# Patient Record
Sex: Male | Born: 1955 | Race: White | Hispanic: No | Marital: Single | Smoking: Never smoker
Health system: Southern US, Community
[De-identification: ages and names within clinical notes are randomized; demographics above are authoritative.]

---

## 2015-06-25 DIAGNOSIS — D890 Polyclonal hypergammaglobulinemia: Secondary | ICD-10-CM | POA: Diagnosis not present

## 2015-06-25 DIAGNOSIS — B1920 Unspecified viral hepatitis C without hepatic coma: Secondary | ICD-10-CM | POA: Diagnosis not present

## 2017-11-30 DIAGNOSIS — F319 Bipolar disorder, unspecified: Secondary | ICD-10-CM

## 2017-11-30 DIAGNOSIS — M109 Gout, unspecified: Secondary | ICD-10-CM

## 2017-11-30 DIAGNOSIS — N183 Chronic kidney disease, stage 3 (moderate): Secondary | ICD-10-CM | POA: Diagnosis not present

## 2017-11-30 DIAGNOSIS — A419 Sepsis, unspecified organism: Secondary | ICD-10-CM

## 2017-11-30 DIAGNOSIS — M4800 Spinal stenosis, site unspecified: Secondary | ICD-10-CM | POA: Diagnosis not present

## 2017-11-30 DIAGNOSIS — N390 Urinary tract infection, site not specified: Secondary | ICD-10-CM

## 2017-11-30 DIAGNOSIS — G540 Brachial plexus disorders: Secondary | ICD-10-CM | POA: Diagnosis not present

## 2017-11-30 DIAGNOSIS — N400 Enlarged prostate without lower urinary tract symptoms: Secondary | ICD-10-CM | POA: Diagnosis not present

## 2017-12-01 DIAGNOSIS — M4800 Spinal stenosis, site unspecified: Secondary | ICD-10-CM | POA: Diagnosis not present

## 2017-12-01 DIAGNOSIS — N183 Chronic kidney disease, stage 3 (moderate): Secondary | ICD-10-CM | POA: Diagnosis not present

## 2017-12-01 DIAGNOSIS — G540 Brachial plexus disorders: Secondary | ICD-10-CM | POA: Diagnosis not present

## 2017-12-01 DIAGNOSIS — N400 Enlarged prostate without lower urinary tract symptoms: Secondary | ICD-10-CM | POA: Diagnosis not present

## 2017-12-02 DIAGNOSIS — G540 Brachial plexus disorders: Secondary | ICD-10-CM | POA: Diagnosis not present

## 2017-12-02 DIAGNOSIS — N400 Enlarged prostate without lower urinary tract symptoms: Secondary | ICD-10-CM | POA: Diagnosis not present

## 2017-12-02 DIAGNOSIS — N183 Chronic kidney disease, stage 3 (moderate): Secondary | ICD-10-CM | POA: Diagnosis not present

## 2017-12-02 DIAGNOSIS — M4800 Spinal stenosis, site unspecified: Secondary | ICD-10-CM | POA: Diagnosis not present

## 2017-12-03 DIAGNOSIS — G540 Brachial plexus disorders: Secondary | ICD-10-CM | POA: Diagnosis not present

## 2017-12-03 DIAGNOSIS — N183 Chronic kidney disease, stage 3 (moderate): Secondary | ICD-10-CM | POA: Diagnosis not present

## 2017-12-03 DIAGNOSIS — N400 Enlarged prostate without lower urinary tract symptoms: Secondary | ICD-10-CM | POA: Diagnosis not present

## 2017-12-03 DIAGNOSIS — M4800 Spinal stenosis, site unspecified: Secondary | ICD-10-CM | POA: Diagnosis not present

## 2018-06-03 DIAGNOSIS — Z8673 Personal history of transient ischemic attack (TIA), and cerebral infarction without residual deficits: Secondary | ICD-10-CM

## 2018-06-03 DIAGNOSIS — A419 Sepsis, unspecified organism: Secondary | ICD-10-CM

## 2018-06-03 DIAGNOSIS — A498 Other bacterial infections of unspecified site: Secondary | ICD-10-CM

## 2018-06-03 DIAGNOSIS — M4800 Spinal stenosis, site unspecified: Secondary | ICD-10-CM

## 2018-06-03 DIAGNOSIS — I10 Essential (primary) hypertension: Secondary | ICD-10-CM

## 2018-06-03 DIAGNOSIS — G40909 Epilepsy, unspecified, not intractable, without status epilepticus: Secondary | ICD-10-CM

## 2018-06-03 DIAGNOSIS — G9341 Metabolic encephalopathy: Secondary | ICD-10-CM

## 2018-06-03 DIAGNOSIS — N179 Acute kidney failure, unspecified: Secondary | ICD-10-CM | POA: Diagnosis not present

## 2018-06-03 DIAGNOSIS — G20 Parkinson's disease: Secondary | ICD-10-CM | POA: Diagnosis not present

## 2018-06-03 DIAGNOSIS — T148XXA Other injury of unspecified body region, initial encounter: Secondary | ICD-10-CM

## 2018-06-04 DIAGNOSIS — G40909 Epilepsy, unspecified, not intractable, without status epilepticus: Secondary | ICD-10-CM | POA: Diagnosis not present

## 2018-06-04 DIAGNOSIS — I10 Essential (primary) hypertension: Secondary | ICD-10-CM | POA: Diagnosis not present

## 2018-06-04 DIAGNOSIS — G20 Parkinson's disease: Secondary | ICD-10-CM | POA: Diagnosis not present

## 2018-06-04 DIAGNOSIS — N179 Acute kidney failure, unspecified: Secondary | ICD-10-CM | POA: Diagnosis not present

## 2018-06-05 DIAGNOSIS — G20 Parkinson's disease: Secondary | ICD-10-CM | POA: Diagnosis not present

## 2018-06-05 DIAGNOSIS — G40909 Epilepsy, unspecified, not intractable, without status epilepticus: Secondary | ICD-10-CM | POA: Diagnosis not present

## 2018-06-05 DIAGNOSIS — I10 Essential (primary) hypertension: Secondary | ICD-10-CM | POA: Diagnosis not present

## 2018-06-05 DIAGNOSIS — N179 Acute kidney failure, unspecified: Secondary | ICD-10-CM | POA: Diagnosis not present

## 2018-06-06 DIAGNOSIS — G20 Parkinson's disease: Secondary | ICD-10-CM | POA: Diagnosis not present

## 2018-06-06 DIAGNOSIS — N179 Acute kidney failure, unspecified: Secondary | ICD-10-CM | POA: Diagnosis not present

## 2018-06-06 DIAGNOSIS — I10 Essential (primary) hypertension: Secondary | ICD-10-CM | POA: Diagnosis not present

## 2018-06-06 DIAGNOSIS — G40909 Epilepsy, unspecified, not intractable, without status epilepticus: Secondary | ICD-10-CM | POA: Diagnosis not present

## 2018-06-07 DIAGNOSIS — G20 Parkinson's disease: Secondary | ICD-10-CM | POA: Diagnosis not present

## 2018-06-07 DIAGNOSIS — G40909 Epilepsy, unspecified, not intractable, without status epilepticus: Secondary | ICD-10-CM | POA: Diagnosis not present

## 2018-06-07 DIAGNOSIS — N179 Acute kidney failure, unspecified: Secondary | ICD-10-CM | POA: Diagnosis not present

## 2018-06-07 DIAGNOSIS — I10 Essential (primary) hypertension: Secondary | ICD-10-CM | POA: Diagnosis not present

## 2018-06-08 DIAGNOSIS — G40909 Epilepsy, unspecified, not intractable, without status epilepticus: Secondary | ICD-10-CM | POA: Diagnosis not present

## 2018-06-08 DIAGNOSIS — I10 Essential (primary) hypertension: Secondary | ICD-10-CM | POA: Diagnosis not present

## 2018-06-08 DIAGNOSIS — G20 Parkinson's disease: Secondary | ICD-10-CM | POA: Diagnosis not present

## 2018-06-08 DIAGNOSIS — N179 Acute kidney failure, unspecified: Secondary | ICD-10-CM | POA: Diagnosis not present

## 2018-10-21 ENCOUNTER — Inpatient Hospital Stay (HOSPITAL_COMMUNITY)
Admission: AD | Admit: 2018-10-21 | Discharge: 2018-10-26 | DRG: 178 | Disposition: A | Discharge status: Skilled Nursing Facility | Payer: Medicare Other | Source: Other Acute Inpatient Hospital | Attending: Internal Medicine | Admitting: Internal Medicine

## 2018-10-21 ENCOUNTER — Other Ambulatory Visit: Payer: Self-pay

## 2018-10-21 ENCOUNTER — Encounter (HOSPITAL_COMMUNITY): Payer: Self-pay

## 2018-10-21 DIAGNOSIS — N183 Chronic kidney disease, stage 3 (moderate): Secondary | ICD-10-CM | POA: Diagnosis present

## 2018-10-21 DIAGNOSIS — N289 Disorder of kidney and ureter, unspecified: Secondary | ICD-10-CM

## 2018-10-21 DIAGNOSIS — L8990 Pressure ulcer of unspecified site, unspecified stage: Secondary | ICD-10-CM | POA: Insufficient documentation

## 2018-10-21 DIAGNOSIS — F209 Schizophrenia, unspecified: Secondary | ICD-10-CM | POA: Diagnosis present

## 2018-10-21 DIAGNOSIS — N179 Acute kidney failure, unspecified: Secondary | ICD-10-CM | POA: Diagnosis present

## 2018-10-21 DIAGNOSIS — G40909 Epilepsy, unspecified, not intractable, without status epilepticus: Secondary | ICD-10-CM | POA: Diagnosis present

## 2018-10-21 DIAGNOSIS — G20 Parkinson's disease: Secondary | ICD-10-CM | POA: Diagnosis present

## 2018-10-21 DIAGNOSIS — M4802 Spinal stenosis, cervical region: Secondary | ICD-10-CM | POA: Diagnosis present

## 2018-10-21 DIAGNOSIS — Z66 Do not resuscitate: Secondary | ICD-10-CM | POA: Diagnosis present

## 2018-10-21 DIAGNOSIS — Z87440 Personal history of urinary (tract) infections: Secondary | ICD-10-CM

## 2018-10-21 DIAGNOSIS — Z23 Encounter for immunization: Secondary | ICD-10-CM | POA: Diagnosis not present

## 2018-10-21 DIAGNOSIS — G540 Brachial plexus disorders: Secondary | ICD-10-CM | POA: Diagnosis present

## 2018-10-21 DIAGNOSIS — Z7401 Bed confinement status: Secondary | ICD-10-CM | POA: Diagnosis not present

## 2018-10-21 DIAGNOSIS — N400 Enlarged prostate without lower urinary tract symptoms: Secondary | ICD-10-CM | POA: Diagnosis present

## 2018-10-21 DIAGNOSIS — J988 Other specified respiratory disorders: Secondary | ICD-10-CM | POA: Diagnosis not present

## 2018-10-21 DIAGNOSIS — I129 Hypertensive chronic kidney disease with stage 1 through stage 4 chronic kidney disease, or unspecified chronic kidney disease: Secondary | ICD-10-CM | POA: Diagnosis present

## 2018-10-21 DIAGNOSIS — F319 Bipolar disorder, unspecified: Secondary | ICD-10-CM | POA: Diagnosis present

## 2018-10-21 DIAGNOSIS — D631 Anemia in chronic kidney disease: Secondary | ICD-10-CM | POA: Diagnosis present

## 2018-10-21 DIAGNOSIS — E876 Hypokalemia: Secondary | ICD-10-CM | POA: Diagnosis not present

## 2018-10-21 DIAGNOSIS — N3000 Acute cystitis without hematuria: Secondary | ICD-10-CM | POA: Diagnosis not present

## 2018-10-21 DIAGNOSIS — E870 Hyperosmolality and hypernatremia: Secondary | ICD-10-CM | POA: Diagnosis not present

## 2018-10-21 DIAGNOSIS — U071 COVID-19: Secondary | ICD-10-CM | POA: Diagnosis present

## 2018-10-21 DIAGNOSIS — R1110 Vomiting, unspecified: Secondary | ICD-10-CM | POA: Diagnosis not present

## 2018-10-21 DIAGNOSIS — N390 Urinary tract infection, site not specified: Secondary | ICD-10-CM | POA: Diagnosis present

## 2018-10-21 LAB — CBC
HCT: 39.7 % (ref 39.0–52.0)
Hemoglobin: 12.4 g/dL — ABNORMAL LOW (ref 13.0–17.0)
MCH: 29.7 pg (ref 26.0–34.0)
MCHC: 31.2 g/dL (ref 30.0–36.0)
MCV: 95.2 fL (ref 80.0–100.0)
Platelets: 101 10*3/uL — ABNORMAL LOW (ref 150–400)
RBC: 4.17 MIL/uL — ABNORMAL LOW (ref 4.22–5.81)
RDW: 15 % (ref 11.5–15.5)
WBC: 6.4 10*3/uL (ref 4.0–10.5)
nRBC: 0 % (ref 0.0–0.2)

## 2018-10-21 LAB — SARS CORONAVIRUS 2 BY RT PCR (HOSPITAL ORDER, PERFORMED IN ~~LOC~~ HOSPITAL LAB): SARS Coronavirus 2: POSITIVE — AB

## 2018-10-21 LAB — CREATININE, SERUM
Creatinine, Ser: 2.31 mg/dL — ABNORMAL HIGH (ref 0.61–1.24)
GFR calc Af Amer: 34 mL/min — ABNORMAL LOW (ref >60)
GFR calc non Af Amer: 30 mL/min — ABNORMAL LOW (ref >60)

## 2018-10-21 LAB — MRSA PCR SCREENING: MRSA by PCR: POSITIVE — AB

## 2018-10-21 MED ORDER — SODIUM CHLORIDE 0.9 % IV SOLN
INTRAVENOUS | Status: DC
  Administered 2018-10-21: 16:00:00 via INTRAVENOUS

## 2018-10-21 MED ORDER — DIVALPROEX SODIUM 500 MG PO DR TAB
1000.0000 mg | DELAYED_RELEASE_TABLET | Freq: Every day | ORAL | Status: DC
  Administered 2018-10-21: 1000 mg via ORAL
  Filled 2018-10-21: qty 2

## 2018-10-21 MED ORDER — VITAMIN C 500 MG PO TABS
500.0000 mg | ORAL_TABLET | Freq: Every day | ORAL | Status: DC
  Administered 2018-10-21 – 2018-10-26 (×6): 500 mg via ORAL
  Filled 2018-10-21 (×6): qty 1

## 2018-10-21 MED ORDER — ONDANSETRON HCL 4 MG/2ML IJ SOLN
4.0000 mg | Freq: Four times a day (QID) | INTRAMUSCULAR | Status: DC | PRN
  Administered 2018-10-21 (×2): 4 mg via INTRAVENOUS
  Filled 2018-10-21 (×2): qty 2

## 2018-10-21 MED ORDER — PERPHENAZINE 4 MG PO TABS
4.0000 mg | ORAL_TABLET | Freq: Every day | ORAL | Status: DC
  Administered 2018-10-21: 4 mg via ORAL
  Filled 2018-10-21: qty 1

## 2018-10-21 MED ORDER — SODIUM CHLORIDE 0.9 % IV SOLN
1.0000 g | INTRAVENOUS | Status: DC
  Administered 2018-10-21 – 2018-10-23 (×3): 1 g via INTRAVENOUS
  Filled 2018-10-21 (×3): qty 10

## 2018-10-21 MED ORDER — LEVETIRACETAM 500 MG PO TABS
500.0000 mg | ORAL_TABLET | Freq: Two times a day (BID) | ORAL | Status: DC
  Filled 2018-10-21 (×2): qty 1

## 2018-10-21 MED ORDER — ENSURE ENLIVE PO LIQD
237.0000 mL | Freq: Two times a day (BID) | ORAL | Status: DC
  Administered 2018-10-21 – 2018-10-26 (×6): 237 mL via ORAL

## 2018-10-21 MED ORDER — PERPHENAZINE 2 MG PO TABS
2.0000 mg | ORAL_TABLET | Freq: Every morning | ORAL | Status: DC
  Filled 2018-10-21: qty 1

## 2018-10-21 MED ORDER — BENZTROPINE MESYLATE 1 MG PO TABS
1.0000 mg | ORAL_TABLET | Freq: Two times a day (BID) | ORAL | Status: DC
  Administered 2018-10-21: 1 mg via ORAL
  Filled 2018-10-21 (×2): qty 1

## 2018-10-21 MED ORDER — ACETAMINOPHEN 650 MG RE SUPP: 650.0000 mg | Freq: Four times a day (QID) | RECTAL | Status: DC | PRN

## 2018-10-21 MED ORDER — ZINC SULFATE 220 (50 ZN) MG PO CAPS
220.0000 mg | ORAL_CAPSULE | Freq: Every day | ORAL | Status: DC
  Administered 2018-10-21 – 2018-10-26 (×6): 220 mg via ORAL
  Filled 2018-10-21 (×6): qty 1

## 2018-10-21 MED ORDER — LEVETIRACETAM 500 MG PO TABS
500.0000 mg | ORAL_TABLET | Freq: Two times a day (BID) | ORAL | Status: DC
  Filled 2018-10-21: qty 1

## 2018-10-21 MED ORDER — ACETAMINOPHEN 325 MG PO TABS
650.0000 mg | ORAL_TABLET | Freq: Four times a day (QID) | ORAL | Status: DC | PRN
  Administered 2018-10-21: 650 mg via ORAL
  Filled 2018-10-21: qty 2

## 2018-10-21 MED ORDER — DIVALPROEX SODIUM 500 MG PO DR TAB
500.0000 mg | DELAYED_RELEASE_TABLET | Freq: Every morning | ORAL | Status: DC
  Filled 2018-10-21: qty 1

## 2018-10-21 MED ORDER — HEPARIN SODIUM (PORCINE) 5000 UNIT/ML IJ SOLN
5000.0000 [IU] | Freq: Three times a day (TID) | INTRAMUSCULAR | Status: DC
  Administered 2018-10-21 – 2018-10-26 (×16): 5000 [IU] via SUBCUTANEOUS
  Filled 2018-10-21 (×16): qty 1

## 2018-10-21 MED ORDER — PNEUMOCOCCAL VAC POLYVALENT 25 MCG/0.5ML IJ INJ
0.5000 mL | INJECTION | INTRAMUSCULAR | Status: AC
  Administered 2018-10-22: 0.5 mL via INTRAMUSCULAR
  Filled 2018-10-21: qty 0.5

## 2018-10-21 NOTE — Progress Notes (Addendum)
Pt arrived from [facility] via ambulance. Pt on RA. VSS. Pt is alert and oriented to self and location. Pt placed on tele, pulse ox, condom cath for incontinence. Pt verbalizes that he is able to feed himself. Will verify with dinner.  MD paged via epic chat to notify of arrival.   Per report from [place] [facility] [place] pt is from [facility].

## 2018-10-21 NOTE — Progress Notes (Signed)
Paged Dr Thereasa Solo to request admit orders.

## 2018-10-21 NOTE — Progress Notes (Signed)
CRITICAL VALUE ALERT  Critical Value: +MRSA (nares), +covid-19  Date & Time Notied:  10/21/18 at 1953  Provider Notified: text page sent to Dr. Roger Shelter  Orders Received/Actions taken: awaiting orders

## 2018-10-21 NOTE — Progress Notes (Signed)
Pt provided with coke to drink. Pt vomited x3 aprox 1 hour after drinking coke. MD notified and pt given PRN zofran & made NPO at this time. See new orders.

## 2018-10-21 NOTE — Progress Notes (Signed)
Dr Waldron [facility] called and verified pt's admission. Will put in orders.

## 2018-10-21 NOTE — Progress Notes (Signed)
Attempted to call patient's mother to introduce myself and provide an update. No answer. Left message.

## 2018-10-21 NOTE — Care Management (Addendum)
Case manager acknowledged referral,will continue to monitor for appropriate disposition when patient medically improves. May God Bless hime to do so. Patient if from [facility],       Ricki Miller, RN BSN Case Manager [phone]

## 2018-10-21 NOTE — H&P (Addendum)
TRH H&P   Patient Demographics:    Jamie Drake, is a 63 y.o. male  MRN: [id number]   DOB - 12/20/1955  Admit Date - 10/21/2018  Outpatient Primary MD for the patient is Shelbie AmmonsHaque, Imran P, MD  Referring MD/NP/PA: From [facility]  Patient coming from: SNF No chief complaint on file.     HPI:   Jamie Drake  is a 63 y.o. male, with Parkinson disease, bed-bound, lives in facility, bipolar disorder, schizophrenia, CVA, cervical stenosis, CKD III, recurrent UTI and thoracic outlet syndrome presenting from SNF with fever.  Patient himself denies any complaints, he is bedbound for last 4 years.  He is however secondary to cervical stenosis, SNF report patient +3 days ago for COVID-19, and had fever 102 at facility so he was sent to ED for further evaluation, patient himself denies any complaints.  In ED it was afebrile, saturating 97% on room air labs were significant for creatinine of 2.3, evaded CRP, total CK and ferritin, had positive UA, he was given Invanz, and transferred to [facility] given his positive COVID-19 for further management.     Review of systems:    Overall patient is poor historian, his review of system is very unreliable. In addition to the HPI above,  Reports  Fever-chills, No Headache, No changes with Vision or hearing, No problems swallowing food or Liquids, No Chest pain, Cough or Shortness of Breath, No Abdominal pain, No Nausea or Vommitting, Bowel movements are regular, No Blood in stool or Urine, No dysuria, No new skin rashes or bruises, No new joints pains-aches,  Patient reported chronic generalized weakness, bedbound for 4 years No recent weight gain or loss, No polyuria, polydypsia or polyphagia, No significant Mental Stressors.    With Past History of the following :   Parkinson disease, bipolar disorder, schizophrenia, CVA, cervical stenosis,  wheelchair bound, CKD-3, recurrent UTI and thoracic outlet syndrome .     Social History:     Social History   Tobacco Use  . Smoking status: Not on file  Substance Use Topics  . Alcohol use: Not on file     Lives - at SNF  Mobility - bed bound      Family History :   Diabetes in mother, cancer in father   Home Medications:        Omega-3 Fatty Acids/Fish Oil [Fish Oil 1,000 mg Capsule] 1 cap PO 0800    Ascorbic Acid [Vitamin C] 500 mg PO DAILY    Multivitamin [Multivitamins] 1 cap PO DAILY    Perphenazine [Trilafon] 2 mg PO QAM    Vitamin E 400 unit PO DAILY    Atorvastatin Calcium [Lipitor] 40 mg PO QHS    Perphenazine 4 mg PO QHS    Tamsulosin HCl [Flomax] 0.4 mg PO 2000    Docusate Sodium [Dok] 100 mg PO QAM    Divalproex  Sodium [Depakote] 500 mg PO QAM    Divalproex Sodium [Depakote] 1,000 mg PO QHS    Benztropine Mesylate [Cogentin] 1 mg PO BID    Metoprolol Tartrate [Lopressor] 12.5 mg PO Q12H    Gabapentin [Neurontin] 100 mg PO BID    Propylene Glycol [Systane Balance] 1 drop OU DAILY    Loperamide HCl [Loperamide] 4 mg PO AS DIR PRN      PRN Reason: Diarrhea    Loperamide HCl [Loperamide] 2 mg PO AS DIR PRN      PRN Reason: Diarrhea    Nitrofurantoin Macrocrystal [Macrodantin] 100 mg PO Q12H    Acetaminophen [Tylenol] 650 mg PO Q6H PRN      PRN Reason: Pain    Ertapenem [Invanz] 1 gm IV Q24H #7 vial    Levetiracetam [Keppra] 500 mg PO BID 10 Days  tablet  Allergies:   No Known Allergies Allergy   Physical Exam:   Vitals  Blood pressure (!) 116/96, pulse 89, temperature 98.5 F (36.9 C), temperature source Oral, resp. rate (!) 30, SpO2 100 %.   1. General pale, elderly male, laying in bed, in no apparent distress  2. Normal affect, mildly confused, impaired judgment and insight currently, oriented x2.  3. No F.N deficits, ALL C.Nerves Intact, have generalized weakness, with significant spasticity, diminished range of motion due to  rigidity.and   4. Ears and Eyes appear Normal, Conjunctivae clear, PERRLA. Moist Oral Mucosa.  5. Supple Neck, No JVD, No cervical lymphadenopathy appriciated, No Carotid Bruits.  6. Symmetrical Chest wall movement, Good air movement bilaterally, CTAB.  7. RRR, No Gallops, Rubs or Murmurs, No Parasternal Heave.  8. Positive Bowel Sounds, Abdomen Soft, No tenderness, No organomegaly appriciated,No rebound -guarding or rigidity.  9.  No Cyanosis, Normal Skin Turgor, No Skin Rash or Bruise.  10. Good muscle tone,  joints appear normal , no effusions, Normal ROM.  11. No Palpable Lymph Nodes in Neck or Axillae     Data Review:   - Results  10/20/18 23:55 [Image 0]  10/21/18 01:00 [Image 1] Laboratory Results - last 24 hr  10/20/18 23:55: Lactic Acid 1.2 10/20/18 23:55: WBC 7.6, RBC 4.72, Hgb 14.1, Hct 43.2, MCV 91, MCH 29.9, MCHC 32.7 L, RDW 16.2 H, Plt Count 128 L, MPV 9.0, Neut % (Auto) 60.7, Lymph % (Auto) 22.3, Mono % (Auto) 16.5 H, Eos % (Auto) 0.1, Baso % (Auto) 0.4, Absolute Neuts (auto) 4.56, Absolute Lymphs (auto) 1.67 10/20/18 23:55: D-Dimer Quant (PE/DVT) 661 H 10/20/18 23:55: Ferritin 574.0 H 10/20/18 23:55: PT 11.1, INR 1.1 10/21/18 01:00: Sodium 146, Potassium 4.2, Chloride 113 H, Carbon Dioxide 19 L, Anion Gap 18 H, BUN 34 H, Creatinine 2.40 H, Estimated GFR (MDRD) 28 L, Glucose 104 H, Calculated Osmolality 289, Calcium 9.5, Prot Corrected Calcium 9.7, Total Bilirubin 0.6, AST 49, ALT 35, Alkaline Phosphatase 84, Total Creatine Kinase 174 H, Troponin I < 0.01, C-Reactive Prot, Quant 35.8 H, Total Protein 8.0, Albumin 3.8 10/21/18 01:00: Procalcitonin 0.15 H 10/21/18 01:00: Urine Color YELLOW, Urine Clarity SL CLDY, Urine pH 5.0, Ur Specific Gravity 1.005, Urine Protein 1+ H, Urine Glucose (UA) NEG, Urine Ketones NEG, Urine Occult Blood 1+ H, Urine Nitrite POS H, Urine Bilirubin NEG, Urine Urobilinogen <2.0, Ur Leukocyte Esterase 2+ H, Urine RBC 0-2, Urine WBC TNTC H,  Urine WBC Clumps PRESENT H, Ur Epithelial Cells 1+, Urine Bacteria 4+ H   - EKG   ** EKG #1 EKG Time: 00:01 -: Yes EKG interpreted by  me Rate: bpm: 93 Rhythm: NSR ST: Nonsp WAS THE FOLEY CATHETER PRESENT ON ADMISSION?: Yes  ----------------------------------------------------------------------------------------------------------------   Imaging Results:   Exam(s): [id number] RAD/DG CHEST PORTABLE CLINICAL DATA:  Fever.  Shortness of breath.  EXAM: PORTABLE CHEST 1 VIEW  COMPARISON:  Chest x-ray dated 06/03/2018  FINDINGS: Heart size is normal. There is no pneumothorax. No large pleural effusion. No acute osseous abnormality. There is slightly greater than expected density in the retrocardiac region. This may be in part due to an overlying cardiac lead.  IMPRESSION: Slightly greater than expected density in the retrocardiac region which may be in part due to an overlying cardiac lead. A developing infiltrate is difficult to exclude in this location. Otherwise, the lungs are unremarkable.   Electronically Signed   By: Constance Holster M.D.   On: 10/21/2018 00:57  Electronically Signed By: Jamse Mead MD  Electronically Signed Date/Time: 06/06/200100 Dictate Date/Time: 10/21/18 0055  Technologist: Pennie Banter L Transcribed By: Odie Sera Transcribed Date/Time: 10/21/18 0057   Assessment & Plan:    Active Problems:   AKI (acute kidney injury) ([place])   COVID-19   Renal insufficiency   UTI (urinary tract infection)   COVID 19 -Patient denies fever, fortunately he has no dyspnea, or hypoxia, has elevated inflammatory markers, will continue to clinically, clinically no indication for Actemra , Remdesivir or  steroids  UTI -Patient with positive urinalysis, he is received Invanz in [facility] ED, I will start on rocephin and follow on urine cultures  AKI on CKD III: -Baseline creatinine 1.5, it is 2.4 on admission, continue with IV fluids .  Parkinson's disease  -Continue with home perphenazine, benztropine .  History of seizures -continue with Depakote.  Hypertension -resume home meds.  BPH -continue home Flomax  Severe spinal stenosis -patient bed bound at nursing facility  DVT Prophylaxis Heparin - SCDs   AM Labs Ordered, also please review Full Orders  Family Communication: Admission, patients condition and plan of care including tests being ordered have been discussed with the patient and mother  who indicate understanding and agree with the plan and Code Status.  Code Status DNR, confirmed by mother  Likely DC to  Back to SNF  Condition GUARDED    Consults called: none  Admission status: inpatient, given UTI requirement for IV antibiotics AKI and requirement of IV fluids,, and COVID-19 positive status requiring close monitoring.  Time spent in minutes : 16 minutes   Phillips Climes M.D on 10/21/2018 at 4:53 PM  Between 7am to 7pm - Pager - [phone]. After 7pm go to [email or web address] - password [facility]  [facility] - Office  [phone]

## 2018-10-22 ENCOUNTER — Inpatient Hospital Stay (HOSPITAL_COMMUNITY): Payer: Medicare Other

## 2018-10-22 DIAGNOSIS — R1110 Vomiting, unspecified: Secondary | ICD-10-CM

## 2018-10-22 LAB — CBC WITH DIFFERENTIAL/PLATELET
Abs Immature Granulocytes: 0.14 10*3/uL — ABNORMAL HIGH (ref 0.00–0.07)
Basophils Absolute: 0 10*3/uL (ref 0.0–0.1)
Basophils Relative: 0 %
Eosinophils Absolute: 0 10*3/uL (ref 0.0–0.5)
Eosinophils Relative: 0 %
HCT: 41.6 % (ref 39.0–52.0)
Hemoglobin: 12.6 g/dL — ABNORMAL LOW (ref 13.0–17.0)
Immature Granulocytes: 2 %
Lymphocytes Relative: 27 %
Lymphs Abs: 1.6 10*3/uL (ref 0.7–4.0)
MCH: 29.2 pg (ref 26.0–34.0)
MCHC: 30.3 g/dL (ref 30.0–36.0)
MCV: 96.5 fL (ref 80.0–100.0)
Monocytes Absolute: 0.8 10*3/uL (ref 0.1–1.0)
Monocytes Relative: 13 %
Neutro Abs: 3.4 10*3/uL (ref 1.7–7.7)
Neutrophils Relative %: 58 %
Platelets: 104 10*3/uL — ABNORMAL LOW (ref 150–400)
RBC: 4.31 MIL/uL (ref 4.22–5.81)
RDW: 15 % (ref 11.5–15.5)
WBC: 5.9 10*3/uL (ref 4.0–10.5)
nRBC: 0 % (ref 0.0–0.2)

## 2018-10-22 LAB — COMPREHENSIVE METABOLIC PANEL
ALT: 27 U/L (ref 0–44)
AST: 42 U/L — ABNORMAL HIGH (ref 15–41)
Albumin: 3 g/dL — ABNORMAL LOW (ref 3.5–5.0)
Alkaline Phosphatase: 63 U/L (ref 38–126)
Anion gap: 8 (ref 5–15)
BUN: 28 mg/dL — ABNORMAL HIGH (ref 6–20)
CO2: 20 mmol/L — ABNORMAL LOW (ref 22–32)
Calcium: 9.2 mg/dL (ref 8.9–10.3)
Chloride: 122 mmol/L — ABNORMAL HIGH (ref 98–111)
Creatinine, Ser: 2.13 mg/dL — ABNORMAL HIGH (ref 0.61–1.24)
GFR calc Af Amer: 38 mL/min — ABNORMAL LOW (ref >60)
GFR calc non Af Amer: 33 mL/min — ABNORMAL LOW (ref >60)
Glucose, Bld: 76 mg/dL (ref 70–99)
Potassium: 3.8 mmol/L (ref 3.5–5.1)
Sodium: 150 mmol/L — ABNORMAL HIGH (ref 135–145)
Total Bilirubin: 0.6 mg/dL (ref 0.3–1.2)
Total Protein: 7.2 g/dL (ref 6.5–8.1)

## 2018-10-22 LAB — FERRITIN: Ferritin: 441 ng/mL — ABNORMAL HIGH (ref 24–336)

## 2018-10-22 LAB — C-REACTIVE PROTEIN: CRP: 4.2 mg/dL — ABNORMAL HIGH (ref <1.0)

## 2018-10-22 LAB — D-DIMER, QUANTITATIVE: D-Dimer, Quant: 0.45 ug/mL-FEU (ref 0.00–0.50)

## 2018-10-22 LAB — ABO/RH: ABO/RH(D): O POS

## 2018-10-22 MED ORDER — METOPROLOL TARTRATE 25 MG/10 ML ORAL SUSPENSION
12.5000 mg | Freq: Two times a day (BID) | ORAL | Status: DC
  Administered 2018-10-22 – 2018-10-26 (×8): 12.5 mg via ORAL
  Filled 2018-10-22 (×11): qty 5

## 2018-10-22 MED ORDER — DIVALPROEX SODIUM 500 MG PO DR TAB
750.0000 mg | DELAYED_RELEASE_TABLET | Freq: Every day | ORAL | Status: DC
  Administered 2018-10-22 – 2018-10-26 (×5): 750 mg via ORAL
  Filled 2018-10-22 (×6): qty 1

## 2018-10-22 MED ORDER — VITAMIN C 500 MG PO TABS: 500.0000 mg | ORAL_TABLET | Freq: Every day | ORAL | Status: DC

## 2018-10-22 MED ORDER — DOCUSATE SODIUM 100 MG PO CAPS: 100.0000 mg | ORAL_CAPSULE | Freq: Two times a day (BID) | ORAL | Status: DC

## 2018-10-22 MED ORDER — ADULT MULTIVITAMIN W/MINERALS CH
1.0000 | ORAL_TABLET | Freq: Every day | ORAL | Status: DC
  Administered 2018-10-22 – 2018-10-26 (×5): 1 via ORAL
  Filled 2018-10-22 (×5): qty 1

## 2018-10-22 MED ORDER — DIVALPROEX SODIUM 500 MG PO DR TAB
1000.0000 mg | DELAYED_RELEASE_TABLET | Freq: Every day | ORAL | Status: DC
  Administered 2018-10-22 – 2018-10-25 (×4): 1000 mg via ORAL
  Filled 2018-10-22 (×5): qty 2

## 2018-10-22 MED ORDER — DOCUSATE SODIUM 50 MG/5ML PO LIQD
100.0000 mg | Freq: Two times a day (BID) | ORAL | Status: DC
  Administered 2018-10-22 – 2018-10-23 (×2): 100 mg via ORAL
  Filled 2018-10-22 (×3): qty 10

## 2018-10-22 MED ORDER — DEXTROSE 5 % IV SOLN: INTRAVENOUS | Status: DC

## 2018-10-22 MED ORDER — PERPHENAZINE 4 MG PO TABS
4.0000 mg | ORAL_TABLET | Freq: Every day | ORAL | Status: DC
  Administered 2018-10-22 – 2018-10-25 (×4): 4 mg via ORAL
  Filled 2018-10-22 (×5): qty 1

## 2018-10-22 MED ORDER — SENNOSIDES-DOCUSATE SODIUM 8.6-50 MG PO TABS
3.0000 | ORAL_TABLET | Freq: Two times a day (BID) | ORAL | Status: DC
  Administered 2018-10-22 – 2018-10-23 (×2): 3 via ORAL
  Filled 2018-10-22 (×2): qty 3

## 2018-10-22 MED ORDER — PERPHENAZINE 2 MG PO TABS
2.0000 mg | ORAL_TABLET | Freq: Every day | ORAL | Status: DC
  Administered 2018-10-22 – 2018-10-26 (×5): 2 mg via ORAL
  Filled 2018-10-22 (×6): qty 1

## 2018-10-22 MED ORDER — ATORVASTATIN CALCIUM 40 MG PO TABS
40.0000 mg | ORAL_TABLET | Freq: Every day | ORAL | Status: DC
  Administered 2018-10-22 – 2018-10-26 (×5): 40 mg via ORAL
  Filled 2018-10-22 (×5): qty 1

## 2018-10-22 MED ORDER — VITAMIN E 180 MG (400 UNIT) PO CAPS
400.0000 [IU] | ORAL_CAPSULE | Freq: Every day | ORAL | Status: DC
  Administered 2018-10-22 – 2018-10-26 (×5): 400 [IU] via ORAL
  Filled 2018-10-22 (×6): qty 1

## 2018-10-22 MED ORDER — METOPROLOL SUCCINATE ER 25 MG PO TB24
12.5000 mg | ORAL_TABLET | Freq: Two times a day (BID) | ORAL | Status: DC
  Filled 2018-10-22: qty 0.5

## 2018-10-22 MED ORDER — POLYVINYL ALCOHOL 1.4 % OP SOLN
1.0000 [drp] | Freq: Every day | OPHTHALMIC | Status: DC
  Administered 2018-10-22 – 2018-10-26 (×5): 1 [drp] via OPHTHALMIC
  Filled 2018-10-22: qty 15

## 2018-10-22 MED ORDER — TAMSULOSIN HCL 0.4 MG PO CAPS
0.4000 mg | ORAL_CAPSULE | Freq: Every day | ORAL | Status: DC
  Administered 2018-10-22 – 2018-10-26 (×5): 0.4 mg via ORAL
  Filled 2018-10-22 (×5): qty 1

## 2018-10-22 MED ORDER — BENZTROPINE MESYLATE 1 MG PO TABS
1.0000 mg | ORAL_TABLET | Freq: Two times a day (BID) | ORAL | Status: DC
  Administered 2018-10-22 – 2018-10-26 (×9): 1 mg via ORAL
  Filled 2018-10-22 (×11): qty 1

## 2018-10-22 MED ORDER — DEXTROSE 5 % IV SOLN
INTRAVENOUS | Status: AC
  Administered 2018-10-22: 08:00:00 via INTRAVENOUS

## 2018-10-22 NOTE — Progress Notes (Addendum)
Patient remained mid to upper 90's on RA throughout the shift. C/o nausea x1 which subsided with IV Zofran. Tolerated water with his meds crushed in applesauce. No further nausea or vomiting.

## 2018-10-22 NOTE — Progress Notes (Signed)
PROGRESS NOTE                                                                                                                                                                                                             Patient Demographics:    Jamie Drake, is a 63 y.o. male, DOB - 06/09/1955, JXB:[id number]  Admit date - 10/21/2018   Admitting Physician Kendell BaneSeyed A Shahmehdi, MD  Outpatient Primary MD for the patient is Shelbie AmmonsHaque, Imran P, MD  LOS - 1       Brief Narrative     Jamie MenJames Drake  is a 63 y.o. male, with Parkinson disease, bed-bound, lives in facility, bipolar disorder, schizophrenia, CVA, cervical stenosis, bedbound for last 4 years, CKD III, recurrent UTI and thoracic outlet syndrome presenting from SNF with fever.  SNF report patient +3 days ago for COVID-19, and had fever 102 at facility so he was sent to ED for further evaluation, patient himself denies any complaints.  In ED it was afebrile, saturating 97% on room air labs were significant for creatinine of 2.3, evaded CRP, total CK and ferritin, had positive UA, he was given Invanz, and transferred to [facility] given his positive COVID-19 for further management.    Subjective:    Jamie Drake still denies any complaints, but per staff he had 3 small episodes of vomiting yesterday upon presentation, no recurrence since, has low-grade temperature 100.8 earlier today.   Assessment  & Plan :    Active Problems:   AKI (acute kidney injury) ([facility])   COVID-19   Renal insufficiency   UTI (urinary tract infection)    COVID 19 -Patient has respiratory symptoms, no indication for Actemra, Remdesivir corticosteroids . -Continue to follow inflammatory markers closely .  UTI -With known history of multiple UTIs in the past, most recently ESBL UTI last January . -Continue with Rocephin for now, follow urine cultures . -We will trend procalcitonin .  AKI on CKD III: -Baseline creatinine 1.5, it  is 2.4 on admission, improving with IV fluids.  Hypernatremia -He was 150 this morning, will change his fluids to D5W,.  Vomiting -Patient had couple episodes of vomiting yesterday, no recurrence, asking if he can eat, will start on full liquid diet, will obtain KUB.  Parkinson's disease -Continue with home perphenazine, benztropine .  History of seizures -continue  with Depakote.  Hypertension -resume home meds.  BPH -continue home Flomax  Severe spinal stenosis -patient bed bound at nursing facility   COVID-19 Labs  Recent Labs    10/22/18 0430  DDIMER 0.45  FERRITIN 441*  CRP 4.2*    Lab Results  Component Value Date   SARSCOV2NAA POSITIVE (A) 10/21/2018     Code Status : DNR  Family Communication  : D/W mother via phone 10/21/2018  Disposition Plan  : back to SNF when stable  Barriers For Discharge : Is on IV fluids, IV antibiotics, still having hypernatremia  Consults  :  none  Procedures  : None  DVT Prophylaxis  :   heparin  Lab Results  Component Value Date   PLT 104 (L) 10/22/2018    Antibiotics  :   Anti-infectives (From admission, onward)   Start     Dose/Rate Route Frequency Ordered Stop   10/21/18 1800  cefTRIAXone (ROCEPHIN) 1 g in sodium chloride 0.9 % 100 mL IVPB     1 g 200 mL/hr over 30 Minutes Intravenous Every 24 hours 10/21/18 1712          Objective:   Vitals:   10/22/18 0214 10/22/18 0413 10/22/18 0414 10/22/18 0800  BP:   120/78 120/76  Pulse: 81 91 100 91  Resp:   (!) 30   Temp:   98.2 F (36.8 C) 99.5 F (37.5 C)  TempSrc:   Oral   SpO2: 95% (!) 85% 94% 95%    Wt Readings from Last 3 Encounters:  No data found for Wt     Intake/Output Summary (Last 24 hours) at 10/22/2018 1146 Last data filed at 10/22/2018 0606 Gross per 24 hour  Intake 99.94 ml  Output 1375 ml  Net -1275.06 ml     Physical Exam  Awake Alert, Oriented X 1, interactive, pleasant Symmetrical Chest wall movement, Good air  movement bilaterally, CTAB RRR,No Gallops,Rubs or new Murmurs, No Parasternal Heave +ve B.Sounds, Abd Soft, No tenderness, No rebound - guarding or rigidity. No Cyanosis, Clubbing or edema, No new Rash or bruise      Data Review:    CBC Recent Labs  Lab 10/21/18 1515 10/22/18 0430  WBC 6.4 5.9  HGB 12.4* 12.6*  HCT 39.7 41.6  PLT 101* 104*  MCV 95.2 96.5  MCH 29.7 29.2  MCHC 31.2 30.3  RDW 15.0 15.0  LYMPHSABS  --  1.6  MONOABS  --  0.8  EOSABS  --  0.0  BASOSABS  --  0.0    Chemistries  Recent Labs  Lab 10/21/18 1515 10/22/18 0430  NA  --  150*  K  --  3.8  CL  --  122*  CO2  --  20*  GLUCOSE  --  76  BUN  --  28*  CREATININE 2.31* 2.13*  CALCIUM  --  9.2  AST  --  42*  ALT  --  27  ALKPHOS  --  63  BILITOT  --  0.6   ------------------------------------------------------------------------------------------------------------------ No results for input(s): CHOL, HDL, LDLCALC, TRIG, CHOLHDL, LDLDIRECT in the last 72 hours.  No results found for: HGBA1C ------------------------------------------------------------------------------------------------------------------ No results for input(s): TSH, T4TOTAL, T3FREE, THYROIDAB in the last 72 hours.  Invalid input(s): FREET3 ------------------------------------------------------------------------------------------------------------------ Recent Labs    10/22/18 0430  FERRITIN 441*    Coagulation profile No results for input(s): INR, PROTIME in the last 168 hours.  Recent Labs    10/22/18 0430  DDIMER 0.45  Cardiac Enzymes No results for input(s): CKMB, TROPONINI, MYOGLOBIN in the last 168 hours.  Invalid input(s): CK ------------------------------------------------------------------------------------------------------------------ No results found for: BNP  Inpatient Medications  Scheduled Meds: . atorvastatin  40 mg Oral Daily  . benztropine  1 mg Oral BID  . divalproex  1,000 mg Oral QHS   . divalproex  750 mg Oral Daily  . docusate  100 mg Oral BID  . feeding supplement (ENSURE ENLIVE)  237 mL Oral BID BM  . heparin  5,000 Units Subcutaneous Q8H  . metoprolol tartrate  12.5 mg Oral BID  . multivitamin with minerals  1 tablet Oral Daily  . perphenazine  2 mg Oral Daily  . perphenazine  4 mg Oral QHS  . polyvinyl alcohol  1 drop Both Eyes Daily  . tamsulosin  0.4 mg Oral Daily  . vitamin C  500 mg Oral Daily  . vitamin E  400 Units Oral Daily  . zinc sulfate  220 mg Oral Daily   Continuous Infusions: . cefTRIAXone (ROCEPHIN)  IV 1 g (10/21/18 1811)  . dextrose 50 mL/hr at 10/22/18 0828   PRN Meds:.acetaminophen, ondansetron (ZOFRAN) IV  Micro Results Recent Results (from the past 240 hour(s))  SARS Coronavirus 2 [facility] order, Performed in [place] hospital lab)     Status: Abnormal   Collection Time: 10/21/18  2:41 PM  Result Value Ref Range Status   SARS Coronavirus 2 POSITIVE (A) NEGATIVE Final    Comment: RESULT CALLED TO, READ BACK BY AND VERIFIED WITH: Jimmye Norman, C RN @1954  ON 10/21/2018 JACKSON,K (NOTE) If result is NEGATIVE SARS-CoV-2 target nucleic acids are NOT DETECTED. The SARS-CoV-2 RNA is generally detectable in upper and lower  respiratory specimens during the acute phase of infection. The lowest  concentration of SARS-CoV-2 viral copies this assay can detect is 250  copies / mL. A negative result does not preclude SARS-CoV-2 infection  and should not be used as the sole basis for treatment or other  patient management decisions.  A negative result may occur with  improper specimen collection / handling, submission of specimen other  than nasopharyngeal swab, presence of viral mutation(s) within the  areas targeted by this assay, and inadequate number of viral copies  (<250 copies / mL). A negative result must be combined with clinical  observations, patient history, and epidemiological information. If result is POSITIVE SARS-CoV-2 target  nucleic acids are DETEC TED. The SARS-CoV-2 RNA is generally detectable in upper and lower  respiratory specimens during the acute phase of infection.  Positive  results are indicative of active infection with SARS-CoV-2.  Clinical  correlation with patient history and other diagnostic information is  necessary to determine patient infection status.  Positive results do  not rule out bacterial infection or co-infection with other viruses. If result is PRESUMPTIVE POSTIVE SARS-CoV-2 nucleic acids MAY BE PRESENT.   A presumptive positive result was obtained on the submitted specimen  and confirmed on repeat testing.  While 2019 novel coronavirus  (SARS-CoV-2) nucleic acids may be present in the submitted sample  additional confirmatory testing may be necessary for epidemiological  and / or clinical management purposes  to differentiate between  SARS-CoV-2 and other Sarbecovirus currently known to infect humans.  If clinically indicated additional testing with an alternate test  methodology (LAB7 453) is advised. The SARS-CoV-2 RNA is generally  detectable in upper and lower respiratory specimens during the acute  phase of infection. The expected result is Negative. Fact Sheet for Patients:  BoilerBrush.com.cyhttps://www.fda.gov/media/136312/download Fact Sheet for Healthcare Providers: https://pope.com/https://www.fda.gov/media/136313/download This test is not yet approved or cleared by the [place] FDA and has been authorized for detection and/or diagnosis of SARS-CoV-2 by FDA under an Emergency Use Authorization (EUA).  This EUA will remain in effect (meaning this test can be used) for the duration of the COVID-19 declaration under Section 564(b)(1) of the Act, 21 U.S.C. section 360bbb-3(b)(1), unless the authorization is terminated or revoked sooner. Performed at [facility], [address] [place], [place] [address]   MRSA PCR Screening     Status: Abnormal   Collection Time: 10/21/18   5:15 PM  Result Value Ref Range Status   MRSA by PCR POSITIVE (A) NEGATIVE Final    Comment:        The GeneXpert MRSA Assay (FDA approved for NASAL specimens only), is one component of a comprehensive MRSA colonization surveillance program. It is not intended to diagnose MRSA infection nor to guide or monitor treatment for MRSA infections. RESULT CALLED TO, READ BACK BY AND VERIFIED WITH: [facility]ILL[facility],C RN @1953  ON 10/21/2018 JACKSON,K Performed at [facility], [address] [place], [place] [address]     Radiology Reports No results found.  Time Spent in minutes  25 minutes   Huey Bienenstockawood Koy Lamp M.D on 10/22/2018 at 11:46 AM  Between 7am to 7pm - Pager - [phone]  After 7pm go to [email or web address] - password [email or web address]  [facility] -  Office  [phone]

## 2018-10-22 NOTE — Progress Notes (Signed)
Pt requested to speak to his mom. Dialed phone and made sure pt connected with mom. Pt able to hold phone for himself.

## 2018-10-22 NOTE — Progress Notes (Signed)
Spoke with pt's brother Shanon Brow, who confirmed that pt's mother should be the primary contact. Changed in pt's chart to reflect same. Gave pt's mother update via telephone.

## 2018-10-23 LAB — COMPREHENSIVE METABOLIC PANEL
ALT: 26 U/L (ref 0–44)
AST: 49 U/L — ABNORMAL HIGH (ref 15–41)
Albumin: 2.8 g/dL — ABNORMAL LOW (ref 3.5–5.0)
Alkaline Phosphatase: 61 U/L (ref 38–126)
Anion gap: 6 (ref 5–15)
BUN: 27 mg/dL — ABNORMAL HIGH (ref 6–20)
CO2: 22 mmol/L (ref 22–32)
Calcium: 9.2 mg/dL (ref 8.9–10.3)
Chloride: 122 mmol/L — ABNORMAL HIGH (ref 98–111)
Creatinine, Ser: 2.09 mg/dL — ABNORMAL HIGH (ref 0.61–1.24)
GFR calc Af Amer: 39 mL/min — ABNORMAL LOW (ref >60)
GFR calc non Af Amer: 33 mL/min — ABNORMAL LOW (ref >60)
Glucose, Bld: 84 mg/dL (ref 70–99)
Potassium: 4 mmol/L (ref 3.5–5.1)
Sodium: 150 mmol/L — ABNORMAL HIGH (ref 135–145)
Total Bilirubin: 0.3 mg/dL (ref 0.3–1.2)
Total Protein: 7 g/dL (ref 6.5–8.1)

## 2018-10-23 LAB — CBC WITH DIFFERENTIAL/PLATELET
Abs Immature Granulocytes: 0.28 10*3/uL — ABNORMAL HIGH (ref 0.00–0.07)
Basophils Absolute: 0 10*3/uL (ref 0.0–0.1)
Basophils Relative: 1 %
Eosinophils Absolute: 0 10*3/uL (ref 0.0–0.5)
Eosinophils Relative: 1 %
HCT: 38.2 % — ABNORMAL LOW (ref 39.0–52.0)
Hemoglobin: 11.7 g/dL — ABNORMAL LOW (ref 13.0–17.0)
Immature Granulocytes: 5 %
Lymphocytes Relative: 37 %
Lymphs Abs: 2.2 10*3/uL (ref 0.7–4.0)
MCH: 29.8 pg (ref 26.0–34.0)
MCHC: 30.6 g/dL (ref 30.0–36.0)
MCV: 97.4 fL (ref 80.0–100.0)
Monocytes Absolute: 0.8 10*3/uL (ref 0.1–1.0)
Monocytes Relative: 15 %
Neutro Abs: 2.4 10*3/uL (ref 1.7–7.7)
Neutrophils Relative %: 41 %
Platelets: 126 10*3/uL — ABNORMAL LOW (ref 150–400)
RBC: 3.92 MIL/uL — ABNORMAL LOW (ref 4.22–5.81)
RDW: 15.3 % (ref 11.5–15.5)
WBC: 5.8 10*3/uL (ref 4.0–10.5)
nRBC: 0 % (ref 0.0–0.2)

## 2018-10-23 LAB — URINE CULTURE: Culture: NO GROWTH

## 2018-10-23 LAB — D-DIMER, QUANTITATIVE: D-Dimer, Quant: <0.27 ug/mL-FEU (ref 0.00–0.50)

## 2018-10-23 LAB — FERRITIN: Ferritin: 541 ng/mL — ABNORMAL HIGH (ref 24–336)

## 2018-10-23 LAB — PROCALCITONIN: Procalcitonin: 0.13 ng/mL

## 2018-10-23 LAB — C-REACTIVE PROTEIN: CRP: 4 mg/dL — ABNORMAL HIGH (ref <1.0)

## 2018-10-23 LAB — LACTATE DEHYDROGENASE: LDH: 195 U/L — ABNORMAL HIGH (ref 98–192)

## 2018-10-23 MED ORDER — DEXTROSE 5 % IV SOLN
INTRAVENOUS | Status: AC
  Administered 2018-10-23 – 2018-10-24 (×2): via INTRAVENOUS

## 2018-10-23 MED ORDER — RESOURCE THICKENUP CLEAR PO POWD
ORAL | Status: DC | PRN
  Administered 2018-10-23: 15:00:00 via ORAL
  Filled 2018-10-23: qty 125

## 2018-10-23 NOTE — TOC Initial Note (Signed)
Transition of Care [facility]) - Initial/Assessment Note    Patient Details  Name: Jamie Drake MRN: [id number] Date of Birth: 06/08/1955  Transition of Care [facility]) CM/SW Contact:    Alberteen Sam, [place] Phone Number: [phone] 10/23/2018, 3:32 PM  Clinical Narrative:                  CSW consulted with patient's mother Dorian Pod regarding discharge planning. Dorian Pod in agreement with patient to return to [facility] once medically ready. CSW informed Dorian Pod this could potentially be tomorrow 6/9, Dorian Pod is thankful her son is doing so well. Dorian Pod reiterated patient has Parkinsons and wanted to ensure we know that, CSW informed her we are aware as it is in MD's notes as well. CSW will continue to follow for discharge planning.   Expected Discharge Plan: Skilled Nursing Facility Barriers to Discharge: Continued Medical Work up   Patient Goals and CMS Choice   CMS Medicare.gov Compare Post Acute Care list provided to:: Patient Represenative (must comment)(Ellen (mother)) Choice offered to / list presented to : Parent(mother Dorian Pod)  Expected Discharge Plan and Services Expected Discharge Plan: [place] Acute Care Choice: [place] Living arrangements for the past 2 months: Single Family Home Expected Discharge Date: 10/24/18                                    Prior Living Arrangements/Services Living arrangements for the past 2 months: Single Family Home Lives with:: Self Patient language and need for interpreter reviewed:: Yes        Need for Family Participation in Patient Care: Yes (Comment) Care giver support system in place?: Yes (comment)   Criminal Activity/Legal Involvement Pertinent to Current Situation/Hospitalization: No - Comment as needed  Activities of Daily Living Home Assistive Devices/Equipment: None ADL Screening (condition at time of admission) Patient's cognitive ability adequate to safely complete daily  activities?: No Is the patient deaf or have difficulty hearing?: No Does the patient have difficulty seeing, even when wearing glasses/contacts?: No Does the patient have difficulty concentrating, remembering, or making decisions?: Yes Patient able to express need for assistance with ADLs?: Yes Does the patient have difficulty dressing or bathing?: Yes Independently performs ADLs?: No Communication: Dependent Is this a change from baseline?: Pre-admission baseline Dressing (OT): Dependent Is this a change from baseline?: Pre-admission baseline Grooming: Dependent Is this a change from baseline?: Pre-admission baseline Feeding: Dependent Is this a change from baseline?: Pre-admission baseline Bathing: Dependent Is this a change from baseline?: Pre-admission baseline Toileting: Dependent Is this a change from baseline?: Pre-admission baseline In/Out Bed: Dependent Is this a change from baseline?: Pre-admission baseline Walks in Home: Dependent Is this a change from baseline?: Pre-admission baseline Does the patient have difficulty walking or climbing stairs?: Yes Weakness of Legs: Both Weakness of Arms/Hands: Both  Permission Sought/Granted Permission sought to share information with : Case Manager, Customer service manager, Family Supports Permission granted to share information with : Yes, Verbal Permission Granted  Share Information with NAME: Dorian Pod  Permission granted to share info w AGENCY: SNFs  Permission granted to share info w Relationship: mother  Permission granted to share info w Contact Information: [phone]  Emotional Assessment Appearance:: Other (Comment Required(unable to assess - remote) Attitude/Demeanor/Rapport: Unable to Assess Affect (typically observed): Unable to Assess Orientation: : Oriented to Self Alcohol / Substance Use: Not Applicable Psych Involvement: No (comment)  Admission diagnosis:  COVID-19 [U07.1, J98.8] UTI (urinary tract  infection) [N39.0] Renal insufficiency [N28.9] Parkinson's disease (HCC) [G20] Patient Active Problem List   Diagnosis Date Noted  . AKI (acute kidney injury) (HCC) 10/21/2018  . COVID-19 10/21/2018  . Renal insufficiency 10/21/2018  . UTI (urinary tract infection) 10/21/2018   PCP:  Shelbie AmmonsHaque, Imran P, MD Pharmacy:  No Pharmacies Listed    Social Determinants of Health (SDOH) Interventions    Readmission Risk Interventions No flowsheet data found.

## 2018-10-23 NOTE — NC FL2 (Signed)
[place] LEVEL OF CARE SCREENING TOOL     IDENTIFICATION  Patient Name: Jamie Drake Birthdate: October 31, 1955 Sex: male Admission Date (Current Location): 10/21/2018  [facility] and [place] Number:  [employer] and Address:  [facility] [place]. [facility], [place] [address] [place], [place] [address]      Provider Number: [id number]  Attending Physician Name and Address:  Albertine Patricia, MD  Relative Name and Phone Number:  Dorian Pod (mother) [phone]    Current Level of Care: Hospital Recommended Level of Care: [place] Prior Approval Number:    Date Approved/Denied: 04/03/18 PASRR Number: [id number]  Discharge Plan: SNF    Current Diagnoses: Patient Active Problem List   Diagnosis Date Noted  . AKI (acute kidney injury) ([place]) 10/21/2018  . COVID-19 10/21/2018  . Renal insufficiency 10/21/2018  . UTI (urinary tract infection) 10/21/2018    Orientation RESPIRATION BLADDER Height & Weight     Self  Normal Incontinent Weight:   Height:     BEHAVIORAL SYMPTOMS/MOOD NEUROLOGICAL BOWEL NUTRITION STATUS      Continent Diet(see discharge summary)  AMBULATORY STATUS COMMUNICATION OF NEEDS Skin   Extensive Assist(maximove used) Verbally Normal                       Personal Care Assistance Level of Assistance  Bathing, Feeding, Dressing, Total care Bathing Assistance: Maximum assistance Feeding assistance: Maximum assistance Dressing Assistance: Maximum assistance Total Care Assistance: Maximum assistance   Functional Limitations Info  Sight, Hearing, Speech Sight Info: Adequate Hearing Info: Adequate Speech Info: Adequate    SPECIAL CARE FACTORS FREQUENCY  OT (By licensed OT), PT (By licensed PT)     PT Frequency: min 5x weekly OT Frequency: min 5x weekly            Contractures Contractures Info: Not present    Additional Factors Info  Code Status, Allergies, Isolation Precautions Code Status  Info: DNR Allergies Info: No Known Allergies     Isolation Precautions Info: air and contact precautions, MRSA and Emerging Pathogen     Current Medications (10/23/2018):  This is the current hospital active medication list Current Facility-Administered Medications  Medication Dose Route Frequency Provider Last Rate Last Dose  . acetaminophen (TYLENOL) suppository 650 mg  650 mg Rectal Q6H PRN Elgergawy, Silver Huguenin, MD      . atorvastatin (LIPITOR) tablet 40 mg  40 mg Oral Daily Elgergawy, Silver Huguenin, MD   40 mg at 10/23/18 1054  . benztropine (COGENTIN) tablet 1 mg  1 mg Oral BID Elgergawy, Silver Huguenin, MD   1 mg at 10/23/18 1053  . cefTRIAXone (ROCEPHIN) 1 g in sodium chloride 0.9 % 100 mL IVPB  1 g Intravenous Q24H Elgergawy, Silver Huguenin, MD 200 mL/hr at 10/22/18 1803 1 g at 10/22/18 1803  . dextrose 5 % solution   Intravenous Continuous Elgergawy, Silver Huguenin, MD 50 mL/hr at 10/23/18 [phone]    . divalproex (DEPAKOTE) DR tablet 1,000 mg  1,000 mg Oral QHS Elgergawy, Silver Huguenin, MD   1,000 mg at 10/22/18 2215  . divalproex (DEPAKOTE) DR tablet 750 mg  750 mg Oral Daily Elgergawy, Silver Huguenin, MD   750 mg at 10/23/18 1054  . docusate (COLACE) 50 MG/5ML liquid 100 mg  100 mg Oral BID Elgergawy, Silver Huguenin, MD   100 mg at 10/23/18 1054  . feeding supplement (ENSURE ENLIVE) (ENSURE ENLIVE) liquid 237 mL  237 mL Oral BID BM Elgergawy,  Leana Roeawood S, MD   237 mL at 10/22/18 1758  . heparin injection 5,000 Units  5,000 Units Subcutaneous Q8H Elgergawy, Leana Roeawood S, MD   5,000 Units at 10/23/18 0531  . metoprolol tartrate (LOPRESSOR) 25 mg/10 mL oral suspension 12.5 mg  12.5 mg Oral BID Elgergawy, Leana Roeawood S, MD   12.5 mg at 10/23/18 1055  . multivitamin with minerals tablet 1 tablet  1 tablet Oral Daily Elgergawy, Leana Roeawood S, MD   1 tablet at 10/23/18 1054  . ondansetron (ZOFRAN) injection 4 mg  4 mg Intravenous Q6H PRN Elgergawy, Leana Roeawood S, MD   4 mg at 10/21/18 2155  . perphenazine (TRILAFON) tablet 2 mg  2 mg Oral Daily Elgergawy,  Leana Roeawood S, MD   2 mg at 10/23/18 1053  . perphenazine (TRILAFON) tablet 4 mg  4 mg Oral QHS Elgergawy, Leana Roeawood S, MD   4 mg at 10/22/18 2215  . polyvinyl alcohol (LIQUIFILM TEARS) 1.4 % ophthalmic solution 1 drop  1 drop Both Eyes Daily Elgergawy, Leana Roeawood S, MD   1 drop at 10/23/18 1051  . Resource ThickenUp Clear   Oral PRN Elgergawy, Leana Roeawood S, MD      . senna-docusate (Senokot-S) tablet 3 tablet  3 tablet Oral BID Elgergawy, Leana Roeawood S, MD   3 tablet at 10/23/18 1102  . tamsulosin (FLOMAX) capsule 0.4 mg  0.4 mg Oral Daily Elgergawy, Leana Roeawood S, MD   0.4 mg at 10/23/18 1055  . vitamin C (ASCORBIC ACID) tablet 500 mg  500 mg Oral Daily Elgergawy, Leana Roeawood S, MD   500 mg at 10/23/18 1055  . vitamin E capsule 400 Units  400 Units Oral Daily Elgergawy, Leana Roeawood S, MD   400 Units at 10/23/18 1052  . zinc sulfate capsule 220 mg  220 mg Oral Daily Elgergawy, Leana Roeawood S, MD   220 mg at 10/23/18 1054     Discharge Medications: Please see discharge summary for a list of discharge medications.  Relevant Imaging Results:  Relevant Lab Results:   Additional Information SSN: [id number]  Gildardo GriffesAshley M Yobana Culliton, LCSW

## 2018-10-23 NOTE — Evaluation (Signed)
Clinical/Bedside Swallow Evaluation Patient Details  Name: Jamie Drake MRN: [id number] Date of Birth: 1955-12-17  Today's Date: 10/23/2018 Time: SLP Start Time (ACUTE ONLY): 1013 SLP Stop Time (ACUTE ONLY): 3235 SLP Time Calculation (min) (ACUTE ONLY): 40 min  Past Medical History: History reviewed. No pertinent past medical history. 63 y.o. male, with Parkinson disease, bed-bound x four years, lives in facility, bipolar disorder, schizophrenia, CVA, cervical stenosis, CKD III, recurrent UTI and thoracic outlet syndrome presenting from SNF with fever. COVID +. HAd several episodes of vomiting after admission.    Assessment / Plan / Recommendation Clinical Impression  Pt presents with hypomimia, reduced ROM of oral mechanism c/w Parkinson's. Spoke with his mother via phone, who reports decline in self-feeding and increased coughing last several months.  Pt with difficulty answering questions/providing hx; affect and engagement improved when he spoke with his mother.  Today, presented with mildly prolonged but effective mastication; overt and consistent s/s of aspiration with consumption of thin liquids (consistent cough)- this was reduced with trials of nectar-thick.  For today, recommend changing diet to dysphagia 3, nectar-thick liquids.  SLP will follow for safety; unable at this point to identify if dysphagia is a progressive consequence of PD or due to decompensation with acute illness.  SLP Visit Diagnosis: Dysphagia, unspecified (R13.10)    Aspiration Risk  Mild aspiration risk    Diet Recommendation   dys 3/nectar Medication Administration: Whole meds with puree    Other  Recommendations Oral Care Recommendations: Oral care BID Other Recommendations: Order thickener from pharmacy   Follow up Recommendations Other (comment)(tba)      Frequency and Duration min 3x week  2 weeks       Prognosis Prognosis for Safe Diet Advancement: Fair      Swallow Study   General Date  of Onset: 10/22/18 HPI: 63 y.o. male, with Parkinson disease, bed-bound x four years, lives in facility, bipolar disorder, schizophrenia, CVA, cervical stenosis, CKD III, recurrent UTI and thoracic outlet syndrome presenting from SNF with fever. COVID +. HAd several episodes of vomiting after admission.  Type of Study: Bedside Swallow Evaluation Previous Swallow Assessment: none per records Diet Prior to this Study: Other (Comment)(full liquids) Temperature Spikes Noted: No Respiratory Status: Room air History of Recent Intubation: No Behavior/Cognition: Alert;Confused;Cooperative Oral Cavity Assessment: Dry Oral Care Completed by SLP: No Oral Cavity - Dentition: Adequate natural dentition Self-Feeding Abilities: Needs assist Patient Positioning: Upright in bed Baseline Vocal Quality: Normal Volitional Cough: Strong Volitional Swallow: Able to elicit    Oral/Motor/Sensory Function Overall Oral Motor/Sensory Function: Other (comment)(reduced ROM; hypomimia)   Ice Chips Ice chips: Within functional limits   Thin Liquid Thin Liquid: Impaired Presentation: Cup;Straw Pharyngeal  Phase Impairments: Cough - Immediate    Nectar Thick Nectar Thick Liquid: Within functional limits   Honey Thick Honey Thick Liquid: Not tested   Puree Puree: Within functional limits   Solid     Solid: Within functional limits      Jamie Drake 10/23/2018,11:11 AM   Jamie Drake, [place] Office number [phone]

## 2018-10-23 NOTE — Progress Notes (Signed)
Initial Nutrition Assessment   RD working remotely.   DOCUMENTATION CODES:   Not applicable  INTERVENTION:   Ensure Enlive po TID, each supplement provides 350 kcal and 20 grams of protein  Pt receiving Hormel Shake daily with Breakfast which provides 520 kcals and 22 g of protein and Magic cup BID with lunch and dinner, each supplement provides 290 kcal and 9 grams of protein, automatically on meal trays to optimize nutritional intake.    Continue MVI with Minerals  NUTRITION DIAGNOSIS:   Inadequate oral intake related to acute illness, vomiting, poor appetite as evidenced by per patient/family report.  GOAL:   Patient will meet greater than or equal to 90% of their needs   MONITOR:   PO intake, Supplement acceptance, Labs, Weight trends  REASON FOR ASSESSMENT:   Malnutrition Screening Tool    ASSESSMENT:   63 yo male admitted with UTI, AKI on CKD III, hypernatremia, COVID 19 +. PMH includes Parkison's Disease, bipolar disorder, schizophrenia, CVA, CKD III, cervical stenosis, bed bound x 4 years, thoracic outlet syndrome  Pt with multiple episodes ov vomiting upon admission. Currently on [place] diet; recorded po intake 75% of meals yesterday  Pt evaluated by SLP today and diet modified to Dysphagia III, Nectar Thick Liquids  No height or weight in system; placed order in system for RN to obtain as able. Unable to estimated nutritional needs at this time due to no height and/or weight  Labs: sodium 150 (H), Creatinine 2.09, BUN 27 Meds: D5 at 50 ml/hr, MVI with minerals, Vit C, vit E, zinc sulfate  NUTRITION - FOCUSED PHYSICAL EXAM:  Unable to assess, working remotely  Diet Order:   Diet Order            DIET DYS 3 Room service appropriate? No; Fluid consistency: Nectar Thick  Diet effective now              EDUCATION NEEDS:   Not appropriate for education at this time  Skin:  Skin Assessment: Reviewed RN Assessment(no pressure injuries noted )  Last  BM:  no documented BM  Height:   Ht Readings from Last 1 Encounters:  No data found for Ht    Weight:   Wt Readings from Last 1 Encounters:  No data found for Wt    Ideal Body Weight:   unable to assess  BMI:  There is no height or weight on file to calculate BMI.  Estimated Nutritional Needs:   Unable to assess as no height and/or weight in system   [employer] MS, RD, LDN, CNSC [phone] Pager  [phone] Weekend/On-Call Pager

## 2018-10-23 NOTE — Progress Notes (Signed)
PROGRESS NOTE                                                                                                                                                                                                             Patient Demographics:    Jamie Drake, is a 63 y.o. male, DOB - 1956-02-10, BOF:[id number]  Admit date - 10/21/2018   Admitting Physician Deatra Cara, MD  Outpatient Primary MD for the patient is Raelyn Number, MD  LOS - 2       Brief Narrative     Jamie Drake  is a 63 y.o. male, with Parkinson disease, bed-bound, lives in facility, bipolar disorder, schizophrenia, CVA, cervical stenosis, bedbound for last 4 years, CKD III, recurrent UTI and thoracic outlet syndrome presenting from SNF with fever.  SNF report patient +3 days ago for COVID-19, and had fever 102 at facility so he was sent to ED for further evaluation, patient himself denies any complaints.  In ED it was afebrile, saturating 97% on room air labs were significant for creatinine of 2, elevated CRP, total CK and ferritin, had positive UA, he was given Invanz, and transferred to [facility] given his positive COVID-19 for further management.    Subjective:    Jamie Drake denies any respiratory symptoms, no shortness of breath, no fever, as well, vomiting .   Assessment  & Plan :    Active Problems:   AKI (acute kidney injury) ([place])   COVID-19   Renal insufficiency   UTI (urinary tract infection)  COVID 19 -Patient has no  respiratory symptoms, no hypoxia, has intermittent fever, so far no indication for Actemra, Remdesivir or corticosteroids . -Continue to follow inflammatory markers closely .  UTI -With known history of multiple UTIs in the past, most recently ESBL UTI last January . -Has been treated with Rocephin,   AKI on CKD III: -Baseline creatinine 1.5, it is 2.4 on admission, improving with IV fluids.  Hypernatremia -Mains at 150 today, continue  with D5W.  Vomiting -resolved, no recurrence  Parkinson's disease -Continue with home perphenazine, benztropine .  History of seizures -continue with Depakote.  Hypertension -resume home meds.  BPH -continue home Flomax  Severe spinal stenosis -patient bed bound at nursing facility   [place]    10/22/18 0430 10/23/18 7782  DDIMER 0.45 <0.27  FERRITIN 441* 541*  LDH  --  195*  CRP 4.2* 4.0*    Lab Results  Component Value Date   SARSCOV2NAA POSITIVE (A) 10/21/2018     Code Status : DNR  Family Communication  : D/W mother via phone 10/21/2018  Disposition Plan  : back to SNF when stable, likely tomorrow.  Barriers For Discharge : he is  on IV fluids, IV antibiotics, still having hypernatremia  Consults  :  none  Procedures  : None  DVT Prophylaxis  :  [place] heparin  Lab Results  Component Value Date   PLT 126 (L) 10/23/2018    Antibiotics  :   Anti-infectives (From admission, onward)   Start     Dose/Rate Route Frequency Ordered Stop   10/21/18 1800  cefTRIAXone (ROCEPHIN) 1 g in sodium chloride 0.9 % 100 mL IVPB     1 g 200 mL/hr over 30 Minutes Intravenous Every 24 hours 10/21/18 1712          Objective:   Vitals:   10/23/18 0000 10/23/18 0400 10/23/18 0800 10/23/18 1053  BP: 99/65 121/86 105/75 115/80  Pulse: 66 70 (!) 56 72  Resp: (!) 24 20    Temp: 97.8 F (36.6 C) 98.6 F (37 C) 97.9 F (36.6 C)   TempSrc:   Oral   SpO2: 96% 98% 94%     Wt Readings from Last 3 Encounters:  No data found for Wt     Intake/Output Summary (Last 24 hours) at 10/23/2018 1144 Last data filed at 10/23/2018 0400 Gross per 24 hour  Intake 1373.47 ml  Output 2400 ml  Net -1026.53 ml     Physical Exam  Awake Alert, Oriented X 1, interactive, pleasant Symmetrical Chest wall movement, Good air movement bilaterally, CTAB RRR,No Gallops,Rubs or new Murmurs, No Parasternal Heave +ve B.Sounds, Abd Soft, No tenderness, No rebound -  guarding or rigidity. No Cyanosis, Clubbing or edema, No new Rash or bruise       Data Review:    CBC Recent Labs  Lab 10/21/18 1515 10/22/18 0430 10/23/18 0328  WBC 6.4 5.9 5.8  HGB 12.4* 12.6* 11.7*  HCT 39.7 41.6 38.2*  PLT 101* 104* 126*  MCV 95.2 96.5 97.4  MCH 29.7 29.2 29.8  MCHC 31.2 30.3 30.6  RDW 15.0 15.0 15.3  LYMPHSABS  --  1.6 2.2  MONOABS  --  0.8 0.8  EOSABS  --  0.0 0.0  BASOSABS  --  0.0 0.0    Chemistries  Recent Labs  Lab 10/21/18 1515 10/22/18 0430 10/23/18 0328  NA  --  150* 150*  K  --  3.8 4.0  CL  --  122* 122*  CO2  --  20* 22  GLUCOSE  --  76 84  BUN  --  28* 27*  CREATININE 2.31* 2.13* 2.09*  CALCIUM  --  9.2 9.2  AST  --  42* 49*  ALT  --  27 26  ALKPHOS  --  63 61  BILITOT  --  0.6 0.3   ------------------------------------------------------------------------------------------------------------------ No results for input(s): CHOL, HDL, LDLCALC, TRIG, CHOLHDL, LDLDIRECT in the last 72 hours.  No results found for: HGBA1C ------------------------------------------------------------------------------------------------------------------ No results for input(s): TSH, T4TOTAL, T3FREE, THYROIDAB in the last 72 hours.  Invalid input(s): FREET3 ------------------------------------------------------------------------------------------------------------------ Recent Labs    10/22/18 0430 10/23/18 0328  FERRITIN 441* 541*    Coagulation profile No results for input(s): INR, PROTIME in the last 168 hours.  Recent Labs    10/22/18 0430 10/23/18 0328  DDIMER 0.45 <0.27    Cardiac Enzymes No results for input(s): CKMB, TROPONINI, MYOGLOBIN in the last 168 hours.  Invalid input(s): CK ------------------------------------------------------------------------------------------------------------------ No results found for: BNP  Inpatient Medications  Scheduled Meds: . atorvastatin  40 mg Oral Daily  . benztropine  1 mg Oral  BID  . divalproex  1,000 mg Oral QHS  . divalproex  750 mg Oral Daily  . docusate  100 mg Oral BID  . feeding supplement (ENSURE ENLIVE)  237 mL Oral BID BM  . heparin  5,000 Units Subcutaneous Q8H  . metoprolol tartrate  12.5 mg Oral BID  . multivitamin with minerals  1 tablet Oral Daily  . perphenazine  2 mg Oral Daily  . perphenazine  4 mg Oral QHS  . polyvinyl alcohol  1 drop Both Eyes Daily  . senna-docusate  3 tablet Oral BID  . tamsulosin  0.4 mg Oral Daily  . vitamin C  500 mg Oral Daily  . vitamin E  400 Units Oral Daily  . zinc sulfate  220 mg Oral Daily   Continuous Infusions: . cefTRIAXone (ROCEPHIN)  IV 1 g (10/22/18 1803)  . dextrose 50 mL/hr at 10/23/18 0824   PRN Meds:.acetaminophen, ondansetron (ZOFRAN) IV, Resource ThickenUp Clear  Micro Results Recent Results (from the past 240 hour(s))  SARS Coronavirus 2 [facility](Hospital order, Performed in [facility] lab)     Status: Abnormal   Collection Time: 10/21/18  2:41 PM  Result Value Ref Range Status   SARS Coronavirus 2 POSITIVE (A) NEGATIVE Final    Comment: RESULT CALLED TO, READ BACK BY AND VERIFIED WITH: Mayford KnifeWILLIAMS, C RN @1954  ON 10/21/2018 JACKSON,K (NOTE) If result is NEGATIVE SARS-CoV-2 target nucleic acids are NOT DETECTED. The SARS-CoV-2 RNA is generally detectable in upper and lower  respiratory specimens during the acute phase of infection. The lowest  concentration of SARS-CoV-2 viral copies this assay can detect is 250  copies / mL. A negative result does not preclude SARS-CoV-2 infection  and should not be used as the sole basis for treatment or other  patient management decisions.  A negative result may occur with  improper specimen collection / handling, submission of specimen other  than nasopharyngeal swab, presence of viral mutation(s) within the  areas targeted by this assay, and inadequate number of viral copies  (<250 copies / mL). A negative result must be combined with clinical   observations, patient history, and epidemiological information. If result is POSITIVE SARS-CoV-2 target nucleic acids are DETEC TED. The SARS-CoV-2 RNA is generally detectable in upper and lower  respiratory specimens during the acute phase of infection.  Positive  results are indicative of active infection with SARS-CoV-2.  Clinical  correlation with patient history and other diagnostic information is  necessary to determine patient infection status.  Positive results do  not rule out bacterial infection or co-infection with other viruses. If result is PRESUMPTIVE POSTIVE SARS-CoV-2 nucleic acids MAY BE PRESENT.   A presumptive positive result was obtained on the submitted specimen  and confirmed on repeat testing.  While 2019 novel coronavirus  (SARS-CoV-2) nucleic acids may be present in the submitted sample  additional confirmatory testing may be necessary for epidemiological  and / or clinical management purposes  to differentiate between  SARS-CoV-2 and other Sarbecovirus currently known to infect humans.  If clinically indicated additional testing with an alternate test  methodology (LAB7 453) is advised. The SARS-CoV-2 RNA  is generally  detectable in upper and lower respiratory specimens during the acute  phase of infection. The expected result is Negative. Fact Sheet for Patients:  BoilerBrush.com.cyhttps://www.fda.gov/media/136312/download Fact Sheet for Healthcare Providers: https://pope.com/https://www.fda.gov/media/136313/download This test is not yet approved or cleared by the [place] FDA and has been authorized for detection and/or diagnosis of SARS-CoV-2 by FDA under an Emergency Use Authorization (EUA).  This EUA will remain in effect (meaning this test can be used) for the duration of the COVID-19 declaration under Section 564(b)(1) of the Act, 21 U.S.C. section 360bbb-3(b)(1), unless the authorization is terminated or revoked sooner. Performed at [facility], [address] [place], [place] [address]   Culture, Urine     Status: None   Collection Time: 10/21/18  5:15 PM  Result Value Ref Range Status   Specimen Description   Final    URINE, RANDOM Performed at [facility], [address] [place], [place] [address]    Special Requests   Final    NONE Performed at [facility], [address] [place], [place] [address]    Culture   Final    NO GROWTH Performed at [facility], [address] [place], [place] [address]    Report Status 10/23/2018 FINAL  Final  MRSA PCR Screening     Status: Abnormal   Collection Time: 10/21/18  5:15 PM  Result Value Ref Range Status   MRSA by PCR POSITIVE (A) NEGATIVE Final    Comment:        The GeneXpert MRSA Assay (FDA approved for NASAL specimens only), is one component of a comprehensive MRSA colonization surveillance program. It is not intended to diagnose MRSA infection nor to guide or monitor treatment for MRSA infections. RESULT CALLED TO, READ BACK BY AND VERIFIED WITH: [facility]ILL[facility],C RN @1953  ON 10/21/2018 JACKSON,K Performed at [facility], [address] [place], [place] [address]     Radiology Reports Dg Abd Portable 1v  Result Date: 10/22/2018 CLINICAL DATA:  Vomiting. EXAM: PORTABLE ABDOMEN - 1 VIEW COMPARISON:  CT 06/03/2018 FINDINGS: Exam demonstrates several air-filled loops of large and small bowel without evidence of obstruction. No evidence of free peritoneal air. No mass or mass effect. Degenerative change of the spine and left hip. IMPRESSION: Nonspecific, nonobstructive bowel gas pattern with air-filled nondilated small bowel loops which may be due to viral gastroenteritis. Electronically Signed   By: Elberta Fortisaniel  Boyle M.D.   On: 10/22/2018 15:47    Time Spent in minutes  25 minutes   Huey Bienenstockawood Medea Deines M.D on 10/23/2018 at 11:44 AM  Between 7am to 7pm - Pager - [phone]  After 7pm go to [email or web address] - password  [email or web address]  [facility] -  Office  [phone]

## 2018-10-24 LAB — COMPREHENSIVE METABOLIC PANEL
ALT: 29 U/L (ref 0–44)
AST: 56 U/L — ABNORMAL HIGH (ref 15–41)
Albumin: 3 g/dL — ABNORMAL LOW (ref 3.5–5.0)
Alkaline Phosphatase: 73 U/L (ref 38–126)
Anion gap: 10 (ref 5–15)
BUN: 24 mg/dL — ABNORMAL HIGH (ref 6–20)
CO2: 16 mmol/L — ABNORMAL LOW (ref 22–32)
Calcium: 9.4 mg/dL (ref 8.9–10.3)
Chloride: 124 mmol/L — ABNORMAL HIGH (ref 98–111)
Creatinine, Ser: 2.02 mg/dL — ABNORMAL HIGH (ref 0.61–1.24)
GFR calc Af Amer: 40 mL/min — ABNORMAL LOW (ref >60)
GFR calc non Af Amer: 35 mL/min — ABNORMAL LOW (ref >60)
Glucose, Bld: 81 mg/dL (ref 70–99)
Potassium: 3.7 mmol/L (ref 3.5–5.1)
Sodium: 150 mmol/L — ABNORMAL HIGH (ref 135–145)
Total Bilirubin: 0.2 mg/dL — ABNORMAL LOW (ref 0.3–1.2)
Total Protein: 7.6 g/dL (ref 6.5–8.1)

## 2018-10-24 LAB — CBC WITH DIFFERENTIAL/PLATELET
Abs Immature Granulocytes: 0.49 10*3/uL — ABNORMAL HIGH (ref 0.00–0.07)
Basophils Absolute: 0.1 10*3/uL (ref 0.0–0.1)
Basophils Relative: 1 %
Eosinophils Absolute: 0.1 10*3/uL (ref 0.0–0.5)
Eosinophils Relative: 1 %
HCT: 39.5 % (ref 39.0–52.0)
Hemoglobin: 12.3 g/dL — ABNORMAL LOW (ref 13.0–17.0)
Immature Granulocytes: 7 %
Lymphocytes Relative: 36 %
Lymphs Abs: 2.5 10*3/uL (ref 0.7–4.0)
MCH: 29.7 pg (ref 26.0–34.0)
MCHC: 31.1 g/dL (ref 30.0–36.0)
MCV: 95.4 fL (ref 80.0–100.0)
Monocytes Absolute: 0.7 10*3/uL (ref 0.1–1.0)
Monocytes Relative: 10 %
Neutro Abs: 3.2 10*3/uL (ref 1.7–7.7)
Neutrophils Relative %: 45 %
Platelets: 146 10*3/uL — ABNORMAL LOW (ref 150–400)
RBC: 4.14 MIL/uL — ABNORMAL LOW (ref 4.22–5.81)
RDW: 15.1 % (ref 11.5–15.5)
WBC: 7 10*3/uL (ref 4.0–10.5)
nRBC: 0 % (ref 0.0–0.2)

## 2018-10-24 LAB — D-DIMER, QUANTITATIVE: D-Dimer, Quant: 0.36 ug/mL-FEU (ref 0.00–0.50)

## 2018-10-24 LAB — LACTATE DEHYDROGENASE: LDH: 247 U/L — ABNORMAL HIGH (ref 98–192)

## 2018-10-24 LAB — PROCALCITONIN: Procalcitonin: 0.28 ng/mL

## 2018-10-24 LAB — FERRITIN: Ferritin: 573 ng/mL — ABNORMAL HIGH (ref 24–336)

## 2018-10-24 LAB — C-REACTIVE PROTEIN: CRP: 2.5 mg/dL — ABNORMAL HIGH (ref <1.0)

## 2018-10-24 MED ORDER — DEXTROSE 5 % IV SOLN
INTRAVENOUS | Status: DC
  Administered 2018-10-24 (×2): via INTRAVENOUS

## 2018-10-24 NOTE — Progress Notes (Signed)
Speech Language Pathology Dysphagia Treatment Patient Details Name: Jamie Drake MRN: [id number] DOB: [birth year] Today's Date: 10/24/2018 Time: 8144-8185 SLP Time Calculation (min) (ACUTE ONLY): 12 min  Assessment / Plan / Recommendation Clinical Impression   Pt more interactive today.  RN just finished assisting pt with lunch, who self-limits to sweet items on his tray.  He was willing to work with me upon second attempt.  Pt only accepted limited solids, but drank 5 oz thin water.  When impulsive and drinking large, successive quantities, he has a tendency to begin coughing.  When sips are controlled and consumed at a slower pace, coughing is minimized. At baseline, pt drinks a lot of water, according to his mother.   Recommend advancing liquids to thin given improved clinical performance, better receptivity to prompting, and higher sodium levels today (impacted likely by dislike of thick liquids).  Pt's mother does describe changes in her son's swallowing the last several months.  It is difficult to identify at bedside if his cough is protective or not - he will benefit from further SLP f/u and perhaps an OP MBS once he returns to his SNF.      Diet Recommendation   dysphagia 3, thin liquids - small sips, one at a time    SLP Plan Continue with current plan of care   Pertinent Vitals/Pain No c/o pain   Swallowing Goals     General Behavior/Cognition: Alert;Cooperative Patient Positioning: Upright in bed Oral care provided: N/A HPI: 63 y.o. male, with Parkinson disease, bed-bound x four years, lives in facility, bipolar disorder, schizophrenia, CVA, cervical stenosis, CKD III, recurrent UTI and thoracic outlet syndrome presenting from SNF with fever. COVID +. HAd several episodes of vomiting after admission.   Oral Cavity - Oral Hygiene     Dysphagia Treatment Family/Caregiver Educated: Jamie Drake, mother via phone Treatment Methods: Skilled observation;Upgraded PO texture  trial Patient observed directly with PO's: Yes Type of PO's observed: Thin liquids;Dysphagia 3 (soft) Feeding: Needs assist Liquids provided via: Straw Pharyngeal Phase Signs & Symptoms: Delayed cough Type of cueing: Verbal Amount of cueing: Minimal   Hani Patnode L. Tivis Ringer, [place] CCC/SLP Acute Rehabilitation Services Office number [phone]       [name] 10/24/2018, 2:40 PM

## 2018-10-24 NOTE — Progress Notes (Signed)
Patient's primary contact (mother) called and updated. She was also put on speaker phone to talk with patient and say goodnight. All questions were answered.

## 2018-10-24 NOTE — Progress Notes (Signed)
SLP Cancellation Note  Patient Details Name: Jamie Drake MRN: [id number] DOB: 07/27/1955   Cancelled treatment: Pt stated  "Please leave me alone!" upon attempts to assist with PO intake, reassess swallow at bedside.  Pt may be D/Cd back to facility today.  Will continue efforts.  Zylpha Poynor L. Tivis Ringer, [place] CCC/SLP Acute Rehabilitation Services Office number [phone]          [name] 10/24/2018, 12:58 PM

## 2018-10-24 NOTE — Progress Notes (Signed)
PROGRESS NOTE                                                                                                                                                                                                             Patient Demographics:    Jamie Drake, is a 63 y.o. male, DOB - Dec 30, 1955, CHY:[id number]  Admit date - 10/21/2018   Admitting Physician Deatra Nayshawn, MD  Outpatient Primary MD for the patient is Raelyn Number, MD  LOS - 3       Brief Narrative    Jamie Drake  is a 63 y.o. male, with Parkinson disease, bed-bound, lives in facility, bipolar disorder, schizophrenia, CVA, cervical stenosis, bedbound for last 4 years, CKD III, recurrent UTI and thoracic outlet syndrome presenting from SNF with fever.  SNF report patient +3 days ago for COVID-19, and had fever 102 at facility so he was sent to ED for further evaluation, patient himself denies any complaints.  In ED it was afebrile, saturating 97% on room air labs were significant for creatinine of 2, elevated CRP, total CK and ferritin, had positive UA, he was given Invanz, and transferred to [facility] given his positive COVID-19 for further management.    Subjective:    Jamie Drake denies any respiratory symptoms, ports improved appetite, good bowel movement yesterday, denies any fever, abdominal pain, no nausea or vomiting    Assessment  & Plan :    Active Problems:   AKI (acute kidney injury) ([place])   COVID-19   Renal insufficiency   UTI (urinary tract infection)  COVID 19 -Patient has no  respiratory symptoms, no hypoxia, has intermittent fever, so far no indication for Actemra, Remdesivir or corticosteroids . -Continue to follow inflammatory markers closely .  UTI -With known history of multiple UTIs in the past, most recently ESBL UTI last January . -Received total of 3 days treatment, 1 days on antibiotics, 2 days on Rocephin.  AKI on CKD III: -Baseline creatinine  1.5, it is 2.4 on admission, has been improving with fluids  Hypernatremia -His sodium remains elevated at 150 despite being on D5W, I will increase to 75 cc/h, he remains on nectar thick liquids, most likely contributing factor to decreased his fluid intake.  Vomiting -resolved, no recurrence  Parkinson's disease -Continue with home perphenazine, benztropine .  History  of seizures -continue with Depakote.  Hypertension -resume home meds.  BPH -continue home Flomax  Severe spinal stenosis -patient bed bound at nursing facility   COVID-19 Labs  Recent Labs    10/22/18 0430 10/23/18 0328 10/24/18 0250  DDIMER 0.45 <0.27 0.36  FERRITIN 441* 541* 573*  LDH  --  195* 247*  CRP 4.2* 4.0* 2.5*    Lab Results  Component Value Date   SARSCOV2NAA POSITIVE (A) 10/21/2018     Code Status : DNR  Family Communication  : D/W mother via phone today, and updated her likely will be going back to SNF tomorrow  Disposition Plan  : back to SNF when stable, likely tomorrow once his sodium has normalized.  Barriers For Discharge : he is  on IV fluids, IV antibiotics, still having hypernatremia  Consults  :  none  Procedures  : None  DVT Prophylaxis  :  [place] heparin  Lab Results  Component Value Date   PLT 146 (L) 10/24/2018    Antibiotics  :   Anti-infectives (From admission, onward)   Start     Dose/Rate Route Frequency Ordered Stop   10/21/18 1800  cefTRIAXone (ROCEPHIN) 1 g in sodium chloride 0.9 % 100 mL IVPB  Status:  Discontinued     1 g 200 mL/hr over 30 Minutes Intravenous Every 24 hours 10/21/18 1712 10/24/18 0827        Objective:   Vitals:   10/23/18 2200 10/24/18 0021 10/24/18 0417 10/24/18 0800  BP: 117/84 103/70 117/88   Pulse: 74 (!) 58 75   Resp: 20 20 20    Temp:   98.8 F (37.1 C) 97.7 F (36.5 C)  TempSrc:   Axillary   SpO2: 94% 93% 98%     Wt Readings from Last 3 Encounters:  No data found for Wt     Intake/Output Summary (Last  24 hours) at 10/24/2018 1402 Last data filed at 10/24/2018 0520 Gross per 24 hour  Intake 600 ml  Output 4900 ml  Net -4300 ml     Physical Exam  Awake Alert, Oriented X 1, impaired cognition and insight. Symmetrical Chest wall movement, Good air movement bilaterally, CTAB RRR,No Gallops,Rubs or new Murmurs, No Parasternal Heave +ve B.Sounds, Abd Soft, No tenderness, No rebound - guarding or rigidity. No Cyanosis, Clubbing or edema, No new Rash or bruise , with diminished ROM due to parkinson disease.    Data Review:    CBC Recent Labs  Lab 10/21/18 1515 10/22/18 0430 10/23/18 0328 10/24/18 0250  WBC 6.4 5.9 5.8 7.0  HGB 12.4* 12.6* 11.7* 12.3*  HCT 39.7 41.6 38.2* 39.5  PLT 101* 104* 126* 146*  MCV 95.2 96.5 97.4 95.4  MCH 29.7 29.2 29.8 29.7  MCHC 31.2 30.3 30.6 31.1  RDW 15.0 15.0 15.3 15.1  LYMPHSABS  --  1.6 2.2 2.5  MONOABS  --  0.8 0.8 0.7  EOSABS  --  0.0 0.0 0.1  BASOSABS  --  0.0 0.0 0.1    Chemistries  Recent Labs  Lab 10/21/18 1515 10/22/18 0430 10/23/18 0328 10/24/18 0250  NA  --  150* 150* 150*  K  --  3.8 4.0 3.7  CL  --  122* 122* 124*  CO2  --  20* 22 16*  GLUCOSE  --  76 84 81  BUN  --  28* 27* 24*  CREATININE 2.31* 2.13* 2.09* 2.02*  CALCIUM  --  9.2 9.2 9.4  AST  --  42* 49*  56*  ALT  --  27 26 29   ALKPHOS  --  63 61 73  BILITOT  --  0.6 0.3 0.2*   ------------------------------------------------------------------------------------------------------------------ No results for input(s): CHOL, HDL, LDLCALC, TRIG, CHOLHDL, LDLDIRECT in the last 72 hours.  No results found for: HGBA1C ------------------------------------------------------------------------------------------------------------------ No results for input(s): TSH, T4TOTAL, T3FREE, THYROIDAB in the last 72 hours.  Invalid input(s): FREET3 ------------------------------------------------------------------------------------------------------------------ Recent Labs     10/23/18 0328 10/24/18 0250  FERRITIN 541* 573*    Coagulation profile No results for input(s): INR, PROTIME in the last 168 hours.  Recent Labs    10/23/18 0328 10/24/18 0250  DDIMER <0.27 0.36    Cardiac Enzymes No results for input(s): CKMB, TROPONINI, MYOGLOBIN in the last 168 hours.  Invalid input(s): CK ------------------------------------------------------------------------------------------------------------------ No results found for: BNP  Inpatient Medications  Scheduled Meds: . atorvastatin  40 mg Oral Daily  . benztropine  1 mg Oral BID  . divalproex  1,000 mg Oral QHS  . divalproex  750 mg Oral Daily  . docusate  100 mg Oral BID  . feeding supplement (ENSURE ENLIVE)  237 mL Oral BID BM  . heparin  5,000 Units Subcutaneous Q8H  . metoprolol tartrate  12.5 mg Oral BID  . multivitamin with minerals  1 tablet Oral Daily  . perphenazine  2 mg Oral Daily  . perphenazine  4 mg Oral QHS  . polyvinyl alcohol  1 drop Both Eyes Daily  . senna-docusate  3 tablet Oral BID  . tamsulosin  0.4 mg Oral Daily  . vitamin C  500 mg Oral Daily  . vitamin E  400 Units Oral Daily  . zinc sulfate  220 mg Oral Daily   Continuous Infusions: . dextrose 75 mL/hr at 10/24/18 0929   PRN Meds:.acetaminophen, ondansetron (ZOFRAN) IV, Resource ThickenUp Clear  Micro Results Recent Results (from the past 240 hour(s))  SARS Coronavirus 2 [facility](Hospital order, Performed in [facility] hospital lab)     Status: Abnormal   Collection Time: 10/21/18  2:41 PM  Result Value Ref Range Status   SARS Coronavirus 2 POSITIVE (A) NEGATIVE Final    Comment: RESULT CALLED TO, READ BACK BY AND VERIFIED WITH: Mayford KnifeWILLIAMS, C RN @1954  ON 10/21/2018 JACKSON,K (NOTE) If result is NEGATIVE SARS-CoV-2 target nucleic acids are NOT DETECTED. The SARS-CoV-2 RNA is generally detectable in upper and lower  respiratory specimens during the acute phase of infection. The lowest  concentration of SARS-CoV-2 viral  copies this assay can detect is 250  copies / mL. A negative result does not preclude SARS-CoV-2 infection  and should not be used as the sole basis for treatment or other  patient management decisions.  A negative result may occur with  improper specimen collection / handling, submission of specimen other  than nasopharyngeal swab, presence of viral mutation(s) within the  areas targeted by this assay, and inadequate number of viral copies  (<250 copies / mL). A negative result must be combined with clinical  observations, patient history, and epidemiological information. If result is POSITIVE SARS-CoV-2 target nucleic acids are DETEC TED. The SARS-CoV-2 RNA is generally detectable in upper and lower  respiratory specimens during the acute phase of infection.  Positive  results are indicative of active infection with SARS-CoV-2.  Clinical  correlation with patient history and other diagnostic information is  necessary to determine patient infection status.  Positive results do  not rule out bacterial infection or co-infection with other viruses. If result is PRESUMPTIVE POSTIVE SARS-CoV-2  nucleic acids MAY BE PRESENT.   A presumptive positive result was obtained on the submitted specimen  and confirmed on repeat testing.  While 2019 novel coronavirus  (SARS-CoV-2) nucleic acids may be present in the submitted sample  additional confirmatory testing may be necessary for epidemiological  and / or clinical management purposes  to differentiate between  SARS-CoV-2 and other Sarbecovirus currently known to infect humans.  If clinically indicated additional testing with an alternate test  methodology (LAB7 453) is advised. The SARS-CoV-2 RNA is generally  detectable in upper and lower respiratory specimens during the acute  phase of infection. The expected result is Negative. Fact Sheet for Patients:  BoilerBrush.com.cyhttps://www.fda.gov/media/136312/download Fact Sheet for Healthcare Providers:  https://pope.com/https://www.fda.gov/media/136313/download This test is not yet approved or cleared by the [place] FDA and has been authorized for detection and/or diagnosis of SARS-CoV-2 by FDA under an Emergency Use Authorization (EUA).  This EUA will remain in effect (meaning this test can be used) for the duration of the COVID-19 declaration under Section 564(b)(1) of the Act, 21 U.S.C. section 360bbb-3(b)(1), unless the authorization is terminated or revoked sooner. Performed at [facility], [address] [place], [place] [address]   Culture, Urine     Status: None   Collection Time: 10/21/18  5:15 PM  Result Value Ref Range Status   Specimen Description   Final    URINE, RANDOM Performed at [facility], [address] [place], [place] [address]    Special Requests   Final    NONE Performed at [facility], [address] [place], [place] [address]    Culture   Final    NO GROWTH Performed at [facility], [address] [place], [place] [address]    Report Status 10/23/2018 FINAL  Final  MRSA PCR Screening     Status: Abnormal   Collection Time: 10/21/18  5:15 PM  Result Value Ref Range Status   MRSA by PCR POSITIVE (A) NEGATIVE Final    Comment:        The GeneXpert MRSA Assay (FDA approved for NASAL specimens only), is one component of a comprehensive MRSA colonization surveillance program. It is not intended to diagnose MRSA infection nor to guide or monitor treatment for MRSA infections. RESULT CALLED TO, READ BACK BY AND VERIFIED WITH: [facility]ILL[facility],C RN @1953  ON 10/21/2018 JACKSON,K Performed at [facility], [address] [place], [place] [address]     Radiology Reports Dg Abd Portable 1v  Result Date: 10/22/2018 CLINICAL DATA:  Vomiting. EXAM: PORTABLE ABDOMEN - 1 VIEW COMPARISON:  CT 06/03/2018 FINDINGS: Exam demonstrates several air-filled loops of large and small bowel without  evidence of obstruction. No evidence of free peritoneal air. No mass or mass effect. Degenerative change of the spine and left hip. IMPRESSION: Nonspecific, nonobstructive bowel gas pattern with air-filled nondilated small bowel loops which may be due to viral gastroenteritis. Electronically Signed   By: Elberta Fortisaniel  Boyle M.D.   On: 10/22/2018 15:47    Time Spent in minutes  25 minutes   Huey Bienenstockawood Salam Chesterfield M.D on 10/24/2018 at 2:02 PM  Between 7am to 7pm - Pager - [phone]  After 7pm go to [email or web address] - password [email or web address]  [facility] -  Office  [phone]

## 2018-10-24 NOTE — Plan of Care (Signed)

## 2018-10-24 NOTE — Progress Notes (Signed)
Called Mikki Harbor, gave her an update on patient. All questions answered. Patient was able to speak to mother on the phone

## 2018-10-24 NOTE — Plan of Care (Signed)

## 2018-10-24 NOTE — Progress Notes (Signed)
Patient does not tolerate turning well. Yells out and resists. Patient off loaded. Noted a small open abrasion to R buttocks while bathing. Rose Fillers and put mepilex on to protect it. Patients bottom is discolored. Patient drank nectar thick apple juice last night, tolerated it well. Will continue to monitor.

## 2018-10-25 DIAGNOSIS — E870 Hyperosmolality and hypernatremia: Secondary | ICD-10-CM

## 2018-10-25 DIAGNOSIS — E876 Hypokalemia: Secondary | ICD-10-CM

## 2018-10-25 LAB — CBC WITH DIFFERENTIAL/PLATELET
Abs Immature Granulocytes: 0.61 10*3/uL — ABNORMAL HIGH (ref 0.00–0.07)
Basophils Absolute: 0.1 10*3/uL (ref 0.0–0.1)
Basophils Relative: 1 %
Eosinophils Absolute: 0.1 10*3/uL (ref 0.0–0.5)
Eosinophils Relative: 1 %
HCT: 37.2 % — ABNORMAL LOW (ref 39.0–52.0)
Hemoglobin: 11.3 g/dL — ABNORMAL LOW (ref 13.0–17.0)
Immature Granulocytes: 9 %
Lymphocytes Relative: 38 %
Lymphs Abs: 2.6 10*3/uL (ref 0.7–4.0)
MCH: 28.9 pg (ref 26.0–34.0)
MCHC: 30.4 g/dL (ref 30.0–36.0)
MCV: 95.1 fL (ref 80.0–100.0)
Monocytes Absolute: 0.5 10*3/uL (ref 0.1–1.0)
Monocytes Relative: 8 %
Neutro Abs: 3.1 10*3/uL (ref 1.7–7.7)
Neutrophils Relative %: 43 %
Platelets: 149 10*3/uL — ABNORMAL LOW (ref 150–400)
RBC: 3.91 MIL/uL — ABNORMAL LOW (ref 4.22–5.81)
RDW: 15.5 % (ref 11.5–15.5)
WBC: 7 10*3/uL (ref 4.0–10.5)
nRBC: 0 % (ref 0.0–0.2)

## 2018-10-25 LAB — COMPREHENSIVE METABOLIC PANEL
ALT: 35 U/L (ref 0–44)
AST: 56 U/L — ABNORMAL HIGH (ref 15–41)
Albumin: 2.8 g/dL — ABNORMAL LOW (ref 3.5–5.0)
Alkaline Phosphatase: 65 U/L (ref 38–126)
Anion gap: 9 (ref 5–15)
BUN: 23 mg/dL — ABNORMAL HIGH (ref 6–20)
CO2: 17 mmol/L — ABNORMAL LOW (ref 22–32)
Calcium: 9 mg/dL (ref 8.9–10.3)
Chloride: 124 mmol/L — ABNORMAL HIGH (ref 98–111)
Creatinine, Ser: 1.86 mg/dL — ABNORMAL HIGH (ref 0.61–1.24)
GFR calc Af Amer: 45 mL/min — ABNORMAL LOW (ref >60)
GFR calc non Af Amer: 38 mL/min — ABNORMAL LOW (ref >60)
Glucose, Bld: 89 mg/dL (ref 70–99)
Potassium: 3.1 mmol/L — ABNORMAL LOW (ref 3.5–5.1)
Sodium: 150 mmol/L — ABNORMAL HIGH (ref 135–145)
Total Bilirubin: 0.5 mg/dL (ref 0.3–1.2)
Total Protein: 6.8 g/dL (ref 6.5–8.1)

## 2018-10-25 LAB — LACTATE DEHYDROGENASE: LDH: 181 U/L (ref 98–192)

## 2018-10-25 LAB — D-DIMER, QUANTITATIVE: D-Dimer, Quant: 0.29 ug/mL-FEU (ref 0.00–0.50)

## 2018-10-25 LAB — C-REACTIVE PROTEIN: CRP: <0.8 mg/dL (ref <1.0)

## 2018-10-25 LAB — FERRITIN: Ferritin: 484 ng/mL — ABNORMAL HIGH (ref 24–336)

## 2018-10-25 MED ORDER — POTASSIUM CHLORIDE 10 MEQ/100ML IV SOLN
10.0000 meq | INTRAVENOUS | Status: AC
  Administered 2018-10-25 (×4): 10 meq via INTRAVENOUS
  Filled 2018-10-25 (×4): qty 100

## 2018-10-25 MED ORDER — POTASSIUM CL IN DEXTROSE 5% 20 MEQ/L IV SOLN
20.0000 meq | INTRAVENOUS | Status: DC
  Administered 2018-10-25 – 2018-10-26 (×4): 20 meq via INTRAVENOUS
  Filled 2018-10-25 (×3): qty 1000

## 2018-10-25 NOTE — Progress Notes (Signed)
  Speech Language Pathology Treatment: Dysphagia  Patient Details Name: Jamie Drake MRN: [id number] DOB: 17-Jan-1956 Today's Date: 10/25/2018 Time: 3845-3646 SLP Time Calculation (min) (ACUTE ONLY): 25 min  Assessment / Plan / Recommendation Clinical Impression  Pt reclined in bed with lunch tray untouched in front of him.  Repositioned, elevated HOB.  Pt self-fed 50% of magic cup and applesauce, needed assistance with the rest.  He declined all other food items on his tray. Able to hold cup with left hand and drink 12 oz of water and portion of iced tea.  Pt continues to present with intermittent cough, increased belching today. He responds to cues to slow down to reduce coughing.  Lung sounds are unchanged (diminished); remains afebrile.  Question esophageal issues impacting intake as well.    Recommend continuing current diet; SLP will follow for safety/education.     HPI HPI: 63 y.o. male, with Parkinson disease, bed-bound x four years, lives in facility, bipolar disorder, schizophrenia, CVA, cervical stenosis, CKD III, recurrent UTI and thoracic outlet syndrome presenting from SNF with fever. COVID +. HAd several episodes of vomiting after admission.       SLP Plan  Continue with current plan of care       Recommendations  Diet recommendations: Dysphagia 3 (mechanical soft);Thin liquid Liquids provided via: Cup;Straw Medication Administration: Whole meds with puree Supervision: Staff to assist with self feeding Compensations: Minimize environmental distractions;Slow rate;Small sips/bites Postural Changes and/or Swallow Maneuvers: Seated upright 90 degrees                Oral Care Recommendations: Oral care BID Follow up Recommendations: Skilled Nursing facility SLP Visit Diagnosis: Dysphagia, unspecified (R13.10) Plan: Continue with current plan of care       [name], MA CCC/SLP Acute Rehabilitation Services Office number  [phone]    Jamie Drake 10/25/2018, 1:54 PM

## 2018-10-25 NOTE — Progress Notes (Signed)
Assisted patient in calling his mother, Dorian Pod.  Patient was happy to speak to his mother and his mother later reported how grateful she was to staff for calling.  Patient's mother was updated on patient care today, plan of care, medication regimen, and labs.  Patient's mother denied concerns or further questions.  She also requested if night shift RN would please help patient call her again to say goodnight tonight.  RN will pass off request in handoff report.

## 2018-10-25 NOTE — Progress Notes (Signed)
PROGRESS NOTE                                                                                                                                                                                                             Patient Demographics:    Jamie MenJames Drake, is a 63 y.o. male, DOB - 01/26/1956, WUJ:[id number]  Admit date - 10/21/2018   Admitting Physician Kendell BaneSeyed A Shahmehdi, MD  Outpatient Primary MD for the patient is Shelbie AmmonsHaque, Imran P, MD  LOS - 4       Brief Narrative    Jamie MenJames Drake  is a 63 y.o. male, with Parkinson disease, bed-bound, lives in facility, bipolar disorder, schizophrenia, CVA, cervical stenosis, bedbound for last 4 years, CKD III, recurrent UTI and thoracic outlet syndrome presenting from SNF with fever. In ED he was afebrile, saturating 97% on room ai. Labs were significant for creatinine of 2, elevated CRP, total CK and ferritin, had positive UA, he was given Invanz, and [facility] given his positive COVID-19 for further management.    Subjective:   Patient slow to respond but denies any respiratory symptoms at this time.  Denies any nausea vomiting.  Denies any pain.   Assessment  & Plan :   COVID 19 Patient did not have any respiratory symptoms or hypoxia.  He saturating normal on room air.  Had only intermittent fever.  There has been no indication for Remdesivir or off label treatment such as Actemra or steroids.  CRP is less than 0.8 today.  Procalcitonin was only 0.28.  D-dimer 0.29.   Acute UTI -With known history of multiple UTIs in the past, most recently ESBL UTI last January . -Received total of 3 days treatment.  Urine culture without growth.  AKI on CKD III: Baseline creatinine 1.5.  Noted to be 2.4 at admission.  Improving with IV fluids.  To be continued.  Hypernatremia and hypokalemia Baseline labs not known.  Sodium remains 150.  D5 water to be continued for additional 24 hours.  He was cleared by  speech therapy to have thin liquids which might help.  Replace potassium.  Check magnesium.  Recheck labs tomorrow.  Vomiting Resolved, no recurrence  Parkinson's disease Continue with home perphenazine, benztropine.  History of seizures Continue with Depakote.  Essential hypertension Monitor blood pressures closely.  Noted to be  on metoprolol.  Borderline low blood pressures noted.  BPH -continue home Flomax  Severe spinal stenosis -patient bed bound at nursing facility  Normocytic anemia No evidence of overt bleeding.   COVID-19 Labs  Recent Labs    10/23/18 0328 10/24/18 0250 10/25/18 0311  DDIMER <0.27 0.36 0.29  FERRITIN 541* 573* 484*  LDH 195* 247* 181  CRP 4.0* 2.5* <0.8    Lab Results  Component Value Date   SARSCOV2NAA POSITIVE (A) 10/21/2018     Code Status : DNR  Family Communication  : Will discuss with family today  Disposition Plan  : Back to skilled nursing facility when improved.  Unfortunately his electrolytes remain deranged.  Barriers For Discharge : Electrolyte abnormalities  Consults  :  none  Procedures  : None  DVT Prophylaxis  :  [place] heparin  Antibiotics  :   Anti-infectives (From admission, onward)   Start     Dose/Rate Route Frequency Ordered Stop   10/21/18 1800  cefTRIAXone (ROCEPHIN) 1 g in sodium chloride 0.9 % 100 mL IVPB  Status:  Discontinued     1 g 200 mL/hr over 30 Minutes Intravenous Every 24 hours 10/21/18 1712 10/24/18 0827        Objective:   Vitals:   10/25/18 0700 10/25/18 0725 10/25/18 0936 10/25/18 1013  BP:  97/73 (!) 104/91   Pulse:  (!) 57  60  Resp:  18    Temp:   97.6 F (36.4 C)   TempSrc:   Axillary   SpO2: 100% 96%      Wt Readings from Last 3 Encounters:  No data found for Wt     Intake/Output Summary (Last 24 hours) at 10/25/2018 1109 Last data filed at 10/25/2018 1011 Gross per 24 hour  Intake 2864.2 ml  Output 2750 ml  Net 114.2 ml     Physical Exam  General  appearance: Awake alert.  In no distress Resp: Clear to auscultation bilaterally.  Normal effort Cardio: S1-S2 is normal regular.  No S3-S4.  No rubs murmurs or bruit GI: Abdomen is soft.  Nontender nondistended.  Bowel sounds are present normal.  No masses organomegaly Extremities: No edema.     Data Review:    CBC Recent Labs  Lab 10/21/18 1515 10/22/18 0430 10/23/18 0328 10/24/18 0250 10/25/18 0311  WBC 6.4 5.9 5.8 7.0 7.0  HGB 12.4* 12.6* 11.7* 12.3* 11.3*  HCT 39.7 41.6 38.2* 39.5 37.2*  PLT 101* 104* 126* 146* 149*  MCV 95.2 96.5 97.4 95.4 95.1  MCH 29.7 29.2 29.8 29.7 28.9  MCHC 31.2 30.3 30.6 31.1 30.4  RDW 15.0 15.0 15.3 15.1 15.5  LYMPHSABS  --  1.6 2.2 2.5 2.6  MONOABS  --  0.8 0.8 0.7 0.5  EOSABS  --  0.0 0.0 0.1 0.1  BASOSABS  --  0.0 0.0 0.1 0.1    Chemistries  Recent Labs  Lab 10/21/18 1515 10/22/18 0430 10/23/18 0328 10/24/18 0250 10/25/18 0311  NA  --  150* 150* 150* 150*  K  --  3.8 4.0 3.7 3.1*  CL  --  122* 122* 124* 124*  CO2  --  20* 22 16* 17*  GLUCOSE  --  76 84 81 89  BUN  --  28* 27* 24* 23*  CREATININE 2.31* 2.13* 2.09* 2.02* 1.86*  CALCIUM  --  9.2 9.2 9.4 9.0  AST  --  42* 49* 56* 56*  ALT  --  27 26 29  35  ALKPHOS  --  63 61 73 65  BILITOT  --  0.6 0.3 0.2* 0.5    Recent Labs    10/24/18 0250 10/25/18 0311  FERRITIN 573* 484*     Recent Labs    10/24/18 0250 10/25/18 0311  DDIMER 0.36 0.29    Inpatient Medications  Scheduled Meds: . atorvastatin  40 mg Oral Daily  . benztropine  1 mg Oral BID  . divalproex  1,000 mg Oral QHS  . divalproex  750 mg Oral Daily  . docusate  100 mg Oral BID  . feeding supplement (ENSURE ENLIVE)  237 mL Oral BID BM  . heparin  5,000 Units Subcutaneous Q8H  . metoprolol tartrate  12.5 mg Oral BID  . multivitamin with minerals  1 tablet Oral Daily  . perphenazine  2 mg Oral Daily  . perphenazine  4 mg Oral QHS  . polyvinyl alcohol  1 drop Both Eyes Daily  . senna-docusate  3  tablet Oral BID  . tamsulosin  0.4 mg Oral Daily  . vitamin C  500 mg Oral Daily  . vitamin E  400 Units Oral Daily  . zinc sulfate  220 mg Oral Daily   Continuous Infusions: . dextrose 5 % with KCl 20 mEq / L 20 mEq (10/25/18 0830)  . potassium chloride 10 mEq (10/25/18 1011)   PRN Meds:.acetaminophen, ondansetron (ZOFRAN) IV, Resource ThickenUp Clear  Micro Results Recent Results (from the past 240 hour(s))  SARS Coronavirus 2 [facility](Hospital order, [facility])     Status: Abnormal   Collection Time: 10/21/18  2:41 PM  Result Value Ref Range Status   SARS Coronavirus 2 POSITIVE (A) NEGATIVE Final    Comment: RESULT CALLED TO, READ BACK BY AND VERIFIED WITH: Mayford KnifeWILLIAMS, C RN @1954  ON 10/21/2018 JACKSON,K (NOTE) If result is NEGATIVE SARS-CoV-2 target nucleic acids are NOT DETECTED. The SARS-CoV-2 RNA is generally detectable in upper and lower  respiratory specimens during the acute phase of infection. The lowest  concentration of SARS-CoV-2 viral copies this assay can detect is 250  copies / mL. A negative result does not preclude SARS-CoV-2 infection  and should not be used as the sole basis for treatment or other  patient management decisions.  A negative result may occur with  improper specimen collection / handling, submission of specimen other  than nasopharyngeal swab, presence of viral mutation(s) within the  areas targeted by this assay, and inadequate number of viral copies  (<250 copies / mL). A negative result must be combined with clinical  observations, patient history, and epidemiological information. If result is POSITIVE SARS-CoV-2 target nucleic acids are DETEC TED. The SARS-CoV-2 RNA is generally detectable in upper and lower  respiratory specimens during the acute phase of infection.  Positive  results are indicative of active infection with SARS-CoV-2.  Clinical  correlation with patient history and other diagnostic information is   necessary to determine patient infection status.  Positive results do  not rule out bacterial infection or co-infection with other viruses. If result is PRESUMPTIVE POSTIVE SARS-CoV-2 nucleic acids MAY BE PRESENT.   A presumptive positive result was obtained on the submitted specimen  and confirmed on repeat testing.  While 2019 novel coronavirus  (SARS-CoV-2) nucleic acids may be present in the submitted sample  additional confirmatory testing may be necessary for epidemiological  and / or clinical management purposes  to differentiate between  SARS-CoV-2 and other Sarbecovirus currently known to infect humans.  If clinically indicated additional testing  with an alternate test  methodology (LAB7 453) is advised. The SARS-CoV-2 RNA is generally  detectable in upper and lower respiratory specimens during the acute  phase of infection. The expected result is Negative. Fact Sheet for Patients:  StrictlyIdeas.no Fact Sheet for Healthcare Providers: BankingDealers.co.za This test is not yet approved or cleared by the [place] FDA and has been authorized for detection and/or diagnosis of SARS-CoV-2 by FDA under an Emergency Use Authorization (EUA).  This EUA will remain in effect (meaning this test can be used) for the duration of the COVID-19 declaration under Section 564(b)(1) of the Act, 21 U.S.C. section 360bbb-3(b)(1), unless the authorization is terminated or revoked sooner. Performed at [facility], [place] [address], [place], [place] [address]   Culture, Urine     Status: None   Collection Time: 10/21/18  5:15 PM  Result Value Ref Range Status   Specimen Description   Final    URINE, RANDOM Performed at [place] [address], [place], [place] [address]    Special Requests   Final    NONE Performed at [facility], [place] [address], [place], [place] [address]     Culture   Final    NO GROWTH Performed at [facility], [place] [address] [place], [place] [address]    Report Status 10/23/2018 FINAL  Final  MRSA PCR Screening     Status: Abnormal   Collection Time: 10/21/18  5:15 PM  Result Value Ref Range Status   MRSA by PCR POSITIVE (A) NEGATIVE Final    Comment:        The GeneXpert MRSA Assay (FDA approved for NASAL specimens only), is one component of a comprehensive MRSA colonization surveillance program. It is not intended to diagnose MRSA infection nor to guide or monitor treatment for MRSA infections. RESULT CALLED TO, READ BACK BY AND VERIFIED WITH: [facility] RN @1953  ON 10/21/2018 JACKSON,K Performed at [facility], [place] [address], [place], [place] [address]     Radiology Reports Dg Abd Portable 1v  Result Date: 10/22/2018 CLINICAL DATA:  Vomiting. EXAM: PORTABLE ABDOMEN - 1 VIEW COMPARISON:  CT 06/03/2018 FINDINGS: Exam demonstrates several air-filled loops of large and small bowel without evidence of obstruction. No evidence of free peritoneal air. No mass or mass effect. Degenerative change of the spine and left hip. IMPRESSION: Nonspecific, nonobstructive bowel gas pattern with air-filled nondilated small bowel loops which may be due to viral gastroenteritis. Electronically Signed   By: Marin Olp M.D.   On: 10/22/2018 15:47     Bonnielee Haff M.D on 10/25/2018 at 11:09 AM  Pager: [email or web address] - password [facility]  [facility] -  Office  [phone]

## 2018-10-26 DIAGNOSIS — L8990 Pressure ulcer of unspecified site, unspecified stage: Secondary | ICD-10-CM | POA: Insufficient documentation

## 2018-10-26 LAB — CBC WITH DIFFERENTIAL/PLATELET
Abs Immature Granulocytes: 0.83 10*3/uL — ABNORMAL HIGH (ref 0.00–0.07)
Basophils Absolute: 0 10*3/uL (ref 0.0–0.1)
Basophils Relative: 0 %
Eosinophils Absolute: 0.2 10*3/uL (ref 0.0–0.5)
Eosinophils Relative: 2 %
HCT: 36.4 % — ABNORMAL LOW (ref 39.0–52.0)
Hemoglobin: 11.3 g/dL — ABNORMAL LOW (ref 13.0–17.0)
Immature Granulocytes: 10 %
Lymphocytes Relative: 38 %
Lymphs Abs: 3.1 10*3/uL (ref 0.7–4.0)
MCH: 29.6 pg (ref 26.0–34.0)
MCHC: 31 g/dL (ref 30.0–36.0)
MCV: 95.3 fL (ref 80.0–100.0)
Monocytes Absolute: 0.7 10*3/uL (ref 0.1–1.0)
Monocytes Relative: 8 %
Neutro Abs: 3.4 10*3/uL (ref 1.7–7.7)
Neutrophils Relative %: 42 %
Platelets: 156 10*3/uL (ref 150–400)
RBC: 3.82 MIL/uL — ABNORMAL LOW (ref 4.22–5.81)
RDW: 15.2 % (ref 11.5–15.5)
WBC: 8.2 10*3/uL (ref 4.0–10.5)
nRBC: 0 % (ref 0.0–0.2)

## 2018-10-26 LAB — COMPREHENSIVE METABOLIC PANEL
ALT: 31 U/L (ref 0–44)
AST: 40 U/L (ref 15–41)
Albumin: 2.7 g/dL — ABNORMAL LOW (ref 3.5–5.0)
Alkaline Phosphatase: 67 U/L (ref 38–126)
Anion gap: 8 (ref 5–15)
BUN: 22 mg/dL — ABNORMAL HIGH (ref 6–20)
CO2: 20 mmol/L — ABNORMAL LOW (ref 22–32)
Calcium: 8.9 mg/dL (ref 8.9–10.3)
Chloride: 117 mmol/L — ABNORMAL HIGH (ref 98–111)
Creatinine, Ser: 1.6 mg/dL — ABNORMAL HIGH (ref 0.61–1.24)
GFR calc Af Amer: 53 mL/min — ABNORMAL LOW (ref >60)
GFR calc non Af Amer: 46 mL/min — ABNORMAL LOW (ref >60)
Glucose, Bld: 83 mg/dL (ref 70–99)
Potassium: 3.9 mmol/L (ref 3.5–5.1)
Sodium: 145 mmol/L (ref 135–145)
Total Bilirubin: 0.6 mg/dL (ref 0.3–1.2)
Total Protein: 6.7 g/dL (ref 6.5–8.1)

## 2018-10-26 LAB — MAGNESIUM: Magnesium: 2.3 mg/dL (ref 1.7–2.4)

## 2018-10-26 MED ORDER — ZINC SULFATE 220 (50 ZN) MG PO CAPS: 220.0000 mg | ORAL_CAPSULE | Freq: Every day | ORAL | Status: AC

## 2018-10-26 NOTE — Discharge Summary (Signed)
[facility]  Physician Discharge Summary   Patient ID: Jamie Drake MRN: [id number] DOB/AGE: 61/11/1955 61 y.o.  Admit date: 10/21/2018 Discharge date: 10/26/2018  PCP: Shelbie AmmonsHaque, Imran P, MD  DISCHARGE DIAGNOSES:  COVID-19 infection, asymptomatic currently Acute kidney injury on chronic kidney disease stage III, resolved Hypernatremia, resolved Urinary tract infection, treated History of Parkinson's disease History of seizure disorder Essential hypertension BPH Severe spinal stenosis Normocytic anemia  RECOMMENDATIONS FOR OUTPATIENT FOLLOW UP: 1. Recommend checking CBC and basic metabolic panel early next week   Home Health: None Equipment/Devices: None  CODE STATUS: DNR  DISCHARGE CONDITION: fair  Diet recommendation: Dysphagia 3 diet with thin liquids. Small sips; meds whole in nectar or puree; chopped meats; assist with tray set-up and feeding.  INITIAL HISTORY: Jamie Drake is a 63 y.o. male, with Parkinson disease, bed-bound, lives in facility, bipolar disorder, schizophrenia, CVA, cervical stenosis, bedbound for last 4 years, CKD III, recurrent UTI and thoracic outlet syndrome presenting from SNF with fever. In ED he was afebrile, saturating 97% on room ai. Labs were significant for creatinine of 2, elevated CRP, total CK and ferritin, had positive UA, he was given Invanz, and [facility] to [facility] given his positive COVID-19 for further management.     HOSPITAL COURSE:   COVID 5019 Patient did not have any respiratory symptoms or hypoxia.  He is saturating normal on room air.  Had only intermittent fever.  There has been no indication for Remdesivir or off label treatment such as Actemra or steroids.  CRP is less than 0.8.  Procalcitonin was only 0.28.  D-dimer 0.29.   Acute UTI -With known history of multiple UTIs in the past, most recently ESBL UTI last January . -Received total of 3 days treatment.  Urine culture without growth.  AKI on CKD III:  Baseline creatinine 1.5.  Noted to be 2.4 at admission.  Improved with IV fluids.  Creatinine is 1.6 today.  Hypernatremia and hypokalemia Baseline labs not known.    Sodium level was noted to be 150.  Patient was placed on D5 infusion.  His oral fluid intake was also liberalized.  Sodium is now back to normal.  Potassium has improved as well.  Magnesium is 2.3.   Vomiting Resolved, no recurrence  Parkinson's disease Continue with home perphenazine, benztropine.  History of seizures Continuewith Depakote.  Essential hypertension Stable.  Continue home medications  BPH Continue home Flomax  Severe spinal stenosis Patient bed bound at nursing facility  Normocytic anemia No evidence of overt bleeding.  Patient has improved.  Okay for discharge today.  He will return to his skilled nursing facility.   PERTINENT LABS:  The results of significant diagnostics from this hospitalization (including imaging, microbiology, ancillary and laboratory) are listed below for reference.    Microbiology: Recent Results (from the past 240 hour(s))  SARS Coronavirus 2 [facility](Hospital order, Performed in [facility] hospital lab)     Status: Abnormal   Collection Time: 10/21/18  2:41 PM   Specimen: Nasopharyngeal Swab  Result Value Ref Range Status   SARS Coronavirus 2 POSITIVE (A) NEGATIVE Final    Comment: RESULT CALLED TO, READ BACK BY AND VERIFIED WITH: Mayford KnifeWILLIAMS, C RN @1954  ON 10/21/2018 JACKSON,K (NOTE) If result is NEGATIVE SARS-CoV-2 target nucleic acids are NOT DETECTED. The SARS-CoV-2 RNA is generally detectable in upper and lower  respiratory specimens during the acute phase of infection. The lowest  concentration of SARS-CoV-2 viral copies this assay can detect is 250  copies / mL.  A negative result does not preclude SARS-CoV-2 infection  and should not be used as the sole basis for treatment or other  patient management decisions.  A negative result may occur with  improper  specimen collection / handling, submission of specimen other  than nasopharyngeal swab, presence of viral mutation(s) within the  areas targeted by this assay, and inadequate number of viral copies  (<250 copies / mL). A negative result must be combined with clinical  observations, patient history, and epidemiological information. If result is POSITIVE SARS-CoV-2 target nucleic acids are DETEC TED. The SARS-CoV-2 RNA is generally detectable in upper and lower  respiratory specimens during the acute phase of infection.  Positive  results are indicative of active infection with SARS-CoV-2.  Clinical  correlation with patient history and other diagnostic information is  necessary to determine patient infection status.  Positive results do  not rule out bacterial infection or co-infection with other viruses. If result is PRESUMPTIVE POSTIVE SARS-CoV-2 nucleic acids MAY BE PRESENT.   A presumptive positive result was obtained on the submitted specimen  and confirmed on repeat testing.  While 2019 novel coronavirus  (SARS-CoV-2) nucleic acids may be present in the submitted sample  additional confirmatory testing may be necessary for epidemiological  and / or clinical management purposes  to differentiate between  SARS-CoV-2 and other Sarbecovirus currently known to infect humans.  If clinically indicated additional testing with an alternate test  methodology (LAB7 453) is advised. The SARS-CoV-2 RNA is generally  detectable in upper and lower respiratory specimens during the acute  phase of infection. The expected result is Negative. Fact Sheet for Patients:  BoilerBrush.com.cyhttps://www.fda.gov/media/136312/download Fact Sheet for Healthcare Providers: https://pope.com/https://www.fda.gov/media/136313/download This test is not yet approved or cleared by the [place] FDA and has been authorized for detection and/or diagnosis of SARS-CoV-2 by FDA under an Emergency Use Authorization (EUA).  This EUA will remain in  effect (meaning this test can be used) for the duration of the COVID-19 declaration under Section 564(b)(1) of the Act, 21 U.S.C. section 360bbb-3(b)(1), unless the authorization is terminated or revoked sooner. Performed at [facility], [address] [place], [place] [address]   Culture, Urine     Status: None   Collection Time: 10/21/18  5:15 PM   Specimen: Urine, Random  Result Value Ref Range Status   Specimen Description   Final    URINE, RANDOM Performed at [facility], [address] [place], [place] [address]    Special Requests   Final    NONE Performed at [facility], [address] [place], [place] [address]    Culture   Final    NO GROWTH Performed at [facility], [address] [place], [place] [address]    Report Status 10/23/2018 FINAL  Final  MRSA PCR Screening     Status: Abnormal   Collection Time: 10/21/18  5:15 PM   Specimen: Nasopharyngeal Swab  Result Value Ref Range Status   MRSA by PCR POSITIVE (A) NEGATIVE Final    Comment:        The GeneXpert MRSA Assay (FDA approved for NASAL specimens only), is one component of a comprehensive MRSA colonization surveillance program. It is not intended to diagnose MRSA infection nor to guide or monitor treatment for MRSA infections. RESULT CALLED TO, READ BACK BY AND VERIFIED WITH: [facility]ILLIAMS,C RN @1953  ON 10/21/2018 JACKSON,K Performed at [facility], [address] [place], [place] [address]  Labs: Basic Metabolic Panel: Recent Labs  Lab 10/22/18 0430 10/23/18 0328 10/24/18 0250 10/25/18 0311 10/26/18 0300  NA 150* 150* 150* 150* 145  K 3.8 4.0 3.7 3.1* 3.9  CL 122* 122* 124* 124* 117*  CO2 20* 22 16* 17* 20*  GLUCOSE 76 84 81 89 83  BUN 28* 27* 24* 23* 22*  CREATININE 2.13* 2.09* 2.02* 1.86* 1.60*  CALCIUM 9.2 9.2 9.4 9.0 8.9  MG  --   --   --   --  2.3   Liver Function Tests: Recent Labs  Lab  10/22/18 0430 10/23/18 0328 10/24/18 0250 10/25/18 0311 10/26/18 0300  AST 42* 49* 56* 56* 40  ALT 35 31  ALKPHOS 63 61 73 65 67  BILITOT 0.6 0.3 0.2* 0.5 0.6  PROT 7.2 7.0 7.6 6.8 6.7  ALBUMIN 3.0* 2.8* 3.0* 2.8* 2.7*   CBC: Recent Labs  Lab 10/22/18 0430 10/23/18 0328 10/24/18 0250 10/25/18 0311 10/26/18 0300  WBC 5.9 5.8 7.0 7.0 8.2  NEUTROABS 3.4 2.4 3.2 3.1 3.4  HGB 12.6* 11.7* 12.3* 11.3* 11.3*  HCT 41.6 38.2* 39.5 37.2* 36.4*  MCV 96.5 97.4 95.4 95.1 95.3  PLT 104* 126* 146* 149* 156     IMAGING STUDIES Dg Abd Portable 1v  Result Date: 10/22/2018 CLINICAL DATA:  Vomiting. EXAM: PORTABLE ABDOMEN - 1 VIEW COMPARISON:  CT 06/03/2018 FINDINGS: Exam demonstrates several air-filled loops of large and small bowel without evidence of obstruction. No evidence of free peritoneal air. No mass or mass effect. Degenerative change of the spine and left hip. IMPRESSION: Nonspecific, nonobstructive bowel gas pattern with air-filled nondilated small bowel loops which may be due to viral gastroenteritis. Electronically Signed   By: Elberta Fortis M.D.   On: 10/22/2018 15:47    DISCHARGE EXAMINATION: Vitals:   10/26/18 0000 10/26/18 0559 10/26/18 0800 10/26/18 0911  BP: 110/75 100/70 113/74   Pulse: (!) 57  (!) 55   Resp:      Temp: 97.9 F (36.6 C) 97.9 F (36.6 C)  98.3 F (36.8 C)  TempSrc: Axillary Oral  Oral  SpO2: 97%  100%    General appearance: Awake alert.  In no distress Resp: Clear to auscultation bilaterally.  Normal effort Cardio: S1-S2 is normal regular.  No S3-S4.  No rubs murmurs or bruit GI: Abdomen is soft.  Nontender nondistended.  Bowel sounds are present normal.  No masses organomegaly    DISPOSITION: SNF  Discharge Instructions    Call MD for:  difficulty breathing, headache or visual disturbances   Complete by: As directed    Call MD for:  extreme fatigue   Complete by: As directed    Call MD for:  hives   Complete by: As directed     Call MD for:  persistant dizziness or light-headedness   Complete by: As directed    Call MD for:  persistant nausea and vomiting   Complete by: As directed    Call MD for:  severe uncontrolled pain   Complete by: As directed    Call MD for:  temperature >100.4   Complete by: As directed    Discharge instructions   Complete by: As directed    COVID 19 INSTRUCTIONS  - You are felt to be stable enough to no longer require inpatient monitoring, testing, and treatment, though you will need to follow the recommendations below: - Based on the CDC's non-test criteria for ending self-isolation: You may not return to work/leave the  home until at least 14 days since symptom onset AND 3 days without a fever (without taking tylenol, ibuprofen, etc.) AND have improvement in respiratory symptoms. - Do not take NSAID medications (including, but not limited to, ibuprofen, advil, motrin, naproxen, aleve, goody's powder, etc.) - Follow up with your doctor in the next week via telehealth or seek medical attention right away if your symptoms get WORSE.  - Consider donating plasma after you have recovered (either 14 days after a negative test or 28 days after symptoms have completely resolved) because your antibodies to this virus may be helpful to give to others with life-threatening infections. Please go to the website www.[email or web address] if you would like to consider volunteering for plasma donation.    Directions for you at home:  Wear a facemask You should wear a facemask that covers your nose and mouth when you are in the same room with other people and when you visit a healthcare provider. People who live with or visit you should also wear a facemask while they are in the same room with you.  Separate yourself from other people in your home As much as possible, you should stay in a different room from other people in your home. Also, you should use a separate bathroom, if available.  Avoid sharing  household items You should not share dishes, drinking glasses, cups, eating utensils, towels, bedding, or other items with other people in your home. After using these items, you should wash them thoroughly with soap and water.  Cover your coughs and sneezes Cover your mouth and nose with a tissue when you cough or sneeze, or you can cough or sneeze into your sleeve. Throw used tissues in a lined trash can, and immediately wash your hands with soap and water for at least 20 seconds or use an alcohol-based hand rub.  Wash your [employer]hands Wash your hands often and thoroughly with soap and water for at least 20 seconds. You can use an alcohol-based hand sanitizer if soap and water are not available and if your hands are not visibly dirty. Avoid touching your eyes, nose, and mouth with unwashed hands.  Directions for those who live with, or provide care at home for you:  Limit the number of people who have contact with the patient If possible, have only one caregiver for the patient. Other household members should stay in another home or place of residence. If this is not possible, they should stay in another room, or be separated from the patient as much as possible. Use a separate bathroom, if available. Restrict visitors who do not have an essential need to be in the home.  Ensure good ventilation Make sure that shared spaces in the home have good air flow, such as from an air conditioner or an opened window, weather permitting.  Wash your hands often Wash your hands often and thoroughly with soap and water for at least 20 seconds. You can use an alcohol based hand sanitizer if soap and water are not available and if your hands are not visibly dirty. Avoid touching your eyes, nose, and mouth with unwashed hands. Use disposable paper towels to dry your hands. If not available, use dedicated cloth towels and replace them when they become wet.  Wear a facemask and gloves Wear a disposable  facemask at all times in the room and gloves when you touch or have contact with the patient's blood, body fluids, and/or secretions or excretions, such as sweat, saliva, sputum, nasal mucus,  vomit, urine, or feces.  Ensure the mask fits over your nose and mouth tightly, and do not touch it during use. Throw out disposable facemasks and gloves after using them. Do not reuse. Wash your hands immediately after removing your facemask and gloves. If your personal clothing becomes contaminated, carefully remove clothing and launder. Wash your hands after handling contaminated clothing. Place all used disposable facemasks, gloves, and other waste in a lined container before disposing them with other household waste. Remove gloves and wash your hands immediately after handling these items.  Do not share dishes, glasses, or other household items with the patient Avoid sharing household items. You should not share dishes, drinking glasses, cups, eating utensils, towels, bedding, or other items with a patient who is confirmed to have, or being evaluated for, COVID-19 infection. After the person uses these items, you should wash them thoroughly with soap and water.  Wash laundry thoroughly Immediately remove and wash clothes or bedding that have blood, body fluids, and/or secretions or excretions, such as sweat, saliva, sputum, nasal mucus, vomit, urine, or feces, on them. Wear gloves when handling laundry from the patient. Read and follow directions on labels of laundry or clothing items and detergent. In general, wash and dry with the warmest temperatures recommended on the label.  Clean all areas the individual has used often Clean all touchable surfaces, such as counters, tabletops, doorknobs, bathroom fixtures, toilets, phones, keyboards, tablets, and bedside tables, every day. Also, clean any surfaces that may have blood, body fluids, and/or secretions or excretions on them. Wear gloves when cleaning  surfaces the patient has come in contact with. Use a diluted bleach solution (e.g., dilute bleach with 1 part bleach and 10 parts water) or a household disinfectant with a label that says EPA-registered for coronaviruses. To make a bleach solution at home, add 1 tablespoon of bleach to 1 quart (4 cups) of water. For a larger supply, add  cup of bleach to 1 gallon (16 cups) of water. Read labels of cleaning products and follow recommendations provided on product labels. Labels contain instructions for safe and effective use of the cleaning product including precautions you should take when applying the product, such as wearing gloves or eye protection and making sure you have good ventilation during use of the product. Remove gloves and wash hands immediately after cleaning.  Monitor yourself for signs and symptoms of illness Caregivers and household members are considered close contacts, should monitor their health, and will be asked to limit movement outside of the home to the extent possible. Follow the monitoring steps for close contacts listed on the symptom monitoring form.   If you have additional questions, contact your local health department or call the epidemiologist on call at [phone] (available 24/7). This guidance is subject to change. For the most up-to-date guidance from [facility], please refer to their website: YouBlogs.pl   You were cared for by a hospitalist during your hospital stay. If you have any questions about your discharge medications or the care you received while you were in the hospital after you are discharged, you can call the unit and asked to speak with the hospitalist on call if the hospitalist that took care of you is not available. Once you are discharged, your primary care physician will handle any further medical issues. Please note that NO REFILLS for any discharge medications will be authorized once  you are discharged, as it is imperative that you return to your primary care physician (or establish a  relationship with a primary care physician if you do not have one) for your aftercare needs so that they can reassess your need for medications and monitor your lab values. If you do not have a primary care physician, you can call [phone] for a physician referral.   Increase activity slowly   Complete by: As directed        Allergies as of 10/26/2018   No Known Allergies     Medication List    TAKE these medications   ascorbic acid 500 MG tablet Commonly known as: VITAMIN C Take 500 mg by mouth daily.   atorvastatin 40 MG tablet Commonly known as: LIPITOR Take 40 mg by mouth daily.   benztropine 1 MG tablet Commonly known as: COGENTIN Take 1 mg by mouth 2 (two) times a day.   divalproex 500 MG DR tablet Commonly known as: DEPAKOTE Take 1,000 mg by mouth at bedtime.   divalproex 250 MG DR tablet Commonly known as: DEPAKOTE Take 750 mg by mouth daily.   docusate sodium 100 MG capsule Commonly known as: COLACE Take 100 mg by mouth 2 (two) times daily.   Fish Oil 1000 MG Caps Take 1,000 mg by mouth daily.   gabapentin 100 MG capsule Commonly known as: NEURONTIN Take 100 mg by mouth 3 (three) times daily as needed for pain.   metoprolol succinate 25 MG 24 hr tablet Commonly known as: TOPROL-XL Take 12.5 mg by mouth 2 (two) times a day.   multivitamin tablet Take 1 tablet by mouth daily.   perphenazine 2 MG tablet Commonly known as: TRILAFON Take 2 mg by mouth daily.   perphenazine 4 MG tablet Commonly known as: TRILAFON Take 4 mg by mouth at bedtime.   Systane Balance 0.6 % Soln Generic drug: Propylene Glycol Apply 1 drop to eye daily.   tamsulosin 0.4 MG Caps capsule Commonly known as: FLOMAX Take 0.4 mg by mouth daily.   vitamin E 400 UNIT capsule Take 400 Units by mouth daily.   zinc sulfate 220 (50 Zn) MG capsule Take 1 capsule (220 mg total) by  mouth daily for 14 days. Start taking on: October 27, 2018          TOTAL DISCHARGE TIME: 35 minutes  Chetan Mehring Rito Ehrlich  [facility] Web designer on [email or web address]  10/26/2018, 10:09 AM

## 2018-10-26 NOTE — Progress Notes (Signed)
Report given to DON, Amanda. Answered all questions and provided a call back number if any further questions are needed.

## 2018-10-26 NOTE — Plan of Care (Signed)

## 2018-10-26 NOTE — TOC Transition Note (Addendum)
Transition of Care [facility]) - CM/SW Discharge Note   Patient Details  Name: Jamie Drake MRN: [id number] Date of Birth: January 26, 1956  [facility] CM/SW Contact:  Alberteen Sam, LCSW Phone Number: 10/26/2018, 11:27 AM   Clinical Narrative:     Patient will DC to: [facility] Anticipated DC date: 10/26/2018 Family notified:Ellen Transport QP:RFFM  Per MD patient ready for DC to [facility] . RN, patient, patient's family, and facility notified of DC. Discharge Summary sent to facility. RN given number for report [phone] ask for the nursing supervisor. DC packet on chart. Ambulance transport requested for patient for 4:00 pm.  CSW signing off.  [place], [place]   Final next level of care: Skilled Nursing Facility Barriers to Discharge: No Barriers Identified   Patient Goals and CMS Choice   CMS Medicare.gov Compare Post Acute Care list provided to:: Patient Represenative (must comment)(Ellen (mother)) Choice offered to / list presented to : Parent(Ellen (mother))  Discharge Placement PASRR number recieved: 10/23/18            Patient chooses bed at: [facility] Patient to be transferred to facility by: [place] Name of family member notified: Dorian Pod Patient and family notified of of transfer: 10/26/18  Discharge Plan and Services     Post Acute Care Choice: [place]                               Social Determinants of Health (SDOH) Interventions     Readmission Risk Interventions No flowsheet data found.

## 2019-03-01 ENCOUNTER — Ambulatory Visit (INDEPENDENT_AMBULATORY_CARE_PROVIDER_SITE_OTHER): Payer: Medicare Other | Admitting: Neurology

## 2019-03-01 ENCOUNTER — Encounter: Payer: Self-pay | Admitting: Neurology

## 2019-03-01 ENCOUNTER — Other Ambulatory Visit: Payer: Self-pay

## 2019-03-01 VITALS — BP 111/75 | HR 71 | Temp 97.3°F

## 2019-03-01 DIAGNOSIS — Z7409 Other reduced mobility: Secondary | ICD-10-CM

## 2019-03-01 DIAGNOSIS — M24569 Contracture, unspecified knee: Secondary | ICD-10-CM | POA: Diagnosis not present

## 2019-03-01 NOTE — Progress Notes (Signed)
Subjective:    Patient ID: Jamie Drake is a 63 y.o. male.  HPI     Star Age, MD, PhD [facility] [address], [address] [place], [place] [address]  Dear Dr. Jannette Fogo,   I saw your patient, Jamie Drake, upon your kind request, in my neurologic clinic today for initial consultation of his parkinsonism.  The patient is accompanied by an aide from his skilled nursing facility today.  He resides at [facility].  His mother joined on speaker phone.   As you know [name] is a 63 year old right-handed gentleman with an underlying complex medical history of spinal stenosis with status post neck surgery, history of epilepsy, chronic kidney disease, history of encephalopathy, history of mood disorder and schizophrenia as well as bipolar disease by chart review, hypertension, gout, history of severe sepsis and septic shock, hyperlipidemia, anemia, hypothyroidism, constipation, and immobility, who was suspected to have Parkinson's disease sometime ago.  His mother reports that he had seen a neurologist some years ago.  He is not currently on any Parkinson's medication.  He takes menstrual pain.  He does take multiple other medications including Depakote, gabapentin, metoprolol, atorvastatin, MiraLAX, perphenazine, tamsulosin.  His mother reports that he has had a progressive decline in his mobility since his neck surgery several years ago.  She reports that he was wheelchair-bound by 4 years ago and for the past 3 years at least he has been bedbound.  He presents in a stretcher today.  He is unable to provide much in the way of history.  His mother reports that he had a brain MRI a couple of years ago.    His Past Medical History Is Significant For: History reviewed. No pertinent past medical history.  His Past Surgical History Is Significant For: History reviewed. No pertinent surgical history.  His Family History Is Significant For: History  reviewed. No pertinent family history.  His Social History Is Significant For: Social History   Socioeconomic History  . Marital status: Single    Spouse name: Not on file  . Number of children: Not on file  . Years of education: Not on file  . Highest education level: Not on file  Occupational History  . Not on file  Social Needs  . Financial resource strain: Not on file  . Food insecurity    Worry: Not on file    Inability: Not on file  . Transportation needs    Medical: Not on file    Non-medical: Not on file  Tobacco Use  . Smoking status: Never Smoker  . Smokeless tobacco: Never Used  Substance and Sexual Activity  . Alcohol use: Not Currently  . Drug use: Not on file  . Sexual activity: Not on file  Lifestyle  . Physical activity    Days per week: Not on file    Minutes per session: Not on file  . Stress: Not on file  Relationships  . Social Herbalist on phone: Not on file    Gets together: Not on file    Attends religious service: Not on file    Active member of club or organization: Not on file    Attends meetings of clubs or organizations: Not on file    Relationship status: Not on file  Other Topics Concern  . Not on file  Social History Narrative  . Not on file    His Allergies Are:  No Known Allergies:  His Current Medications Are:  Outpatient Encounter Medications as of 03/01/2019  Medication Sig  . acetaminophen (TYLENOL) 325 MG tablet Take 650 mg by mouth every 6 (six) hours as needed.  Marland Kitchen. antiseptic oral rinse (BIOTENE) LIQD 15 mLs by Mouth Rinse route as needed for dry mouth.  Marland Kitchen. ascorbic acid (VITAMIN C) 500 MG tablet Take 500 mg by mouth daily.  Marland Kitchen. atorvastatin (LIPITOR) 40 MG tablet Take 40 mg by mouth daily.  . benztropine (COGENTIN) 1 MG tablet Take 1 mg by mouth 2 (two) times a day.  . divalproex (DEPAKOTE) 250 MG DR tablet Take 750 mg by mouth daily.   . divalproex (DEPAKOTE) 500 MG DR tablet Take 1,000 mg by mouth at  bedtime.  . docusate sodium (COLACE) 100 MG capsule Take 100 mg by mouth 2 (two) times daily.  Marland Kitchen. gabapentin (NEURONTIN) 100 MG capsule Take 100 mg by mouth 3 (three) times daily as needed for pain.  . metoprolol succinate (TOPROL-XL) 25 MG 24 hr tablet Take 12.5 mg by mouth 2 (two) times a day.  . Multiple Vitamin (MULTIVITAMIN) tablet Take 1 tablet by mouth daily.  . Omega-3 Fatty Acids (FISH OIL) 1000 MG CAPS Take 1,000 mg by mouth daily.  . ondansetron (ZOFRAN) 4 MG tablet Take 4 mg by mouth every 6 (six) hours as needed for nausea or vomiting.  Marland Kitchen. perphenazine (TRILAFON) 2 MG tablet Take 2 mg by mouth daily.  Marland Kitchen. Propylene Glycol (SYSTANE BALANCE) 0.6 % SOLN Apply 1 drop to eye daily.  . tamsulosin (FLOMAX) 0.4 MG CAPS capsule Take 0.4 mg by mouth daily.  . vitamin E 400 UNIT capsule Take 400 Units by mouth daily.  . [DISCONTINUED] perphenazine (TRILAFON) 4 MG tablet Take 4 mg by mouth at bedtime.   No facility-administered encounter medications on file as of 03/01/2019.   :   Review of Systems:  Out of a complete 14 point review of systems, all are reviewed and negative with the exception of these symptoms as listed below:  Review of Systems  Neurological:       Pt presents today to discuss his stiffness. Pt believes that his stiffness is from Parkinson's disease. Pt does not know if he has tried anything for Parkinson's disease.    Objective:  Neurological Exam  Physical Exam Physical Examination:   Vitals:   03/01/19 1415  BP: 111/75  Pulse: 71  Temp: (!) 97.3 F (36.3 C)   General Examination: The patient is On a stretcher and strapped in with 2 belts.  He has obvious lower extremity Contractures, he has bilateral foot drop.  He has increased tone in the upper extremities with some spasticity noted and some contractures in the elbows, no resting tremor noted in the upper or lower extremities, no significant facial masking.  He has some neck stiffness, extraocular tracking  is preserved, speech is dysarthric and difficult to understand.  He is unable to provide a history, his memory and knowledge and comprehension appear to be impaired.  HEENT: Normocephalic, atraumatic, pupils are equal, round and reactive to light. He has no obvious facial masking, tracking is fairly well-preserved, hearing appears to be intact.  Airway examination reveals no abnormal involuntary tongue movements, tongue protrudes centrally and palate elevates symmetrically.  He has no carotid bruits.  Chest: Clear to auscultation without wheezing, rhonchi or crackles noted.  Heart: S1+S2+0, regular and normal without murmurs, rubs or gallops noted.   Abdomen: Soft, non-tender and non-distended with normal bowel sounds appreciated on  auscultation.  Extremities: There is no pitting edema in the distal lower extremities bilaterally. Pedal pulses are intact.  Skin: Warm and dry without trophic changes noted.  Musculoskeletal: exam revealsContractures in the ankles and knees, bilateral foot drop.  Mild contractures in the upper extremities.  Neurologically:  Cranial nerves II - XII are as described above under HEENT exam. In addition: shoulder shrug is normal with equal shoulder height noted. Motor exam: Thin bulk, no cogwheeling, no resting tremor, reflexes are 2+ in the upper extremities and diminished in the lower extremities, toes are equivocal bilaterally.  Strength appears to be 3+ in the upper extremities and 2+ in the lower extremities, gait, posture, and walking are not assessable, he is stretcher bound. He has been bedbound for the past 3 years per mom's report.  Sensory exam: intact to light touch.  Assessment and Plan:   In summary, Christoffer Currier is a very pleasant 63 y.o.-year old male with an underlying complex medical history of spinal stenosis with status post neck surgery, history of epilepsy, chronic kidney disease, history of encephalopathy, history of mood disorder and  schizophrenia as well as bipolar disease by chart review, hypertension, gout, history of severe sepsis and septic shock, hyperlipidemia, anemia, hypothyroidism, constipation, and immobility, who Presents for evaluation for concern for parkinsonism.  Exam is limited today but does not show any obvious evidence of parkinsonism or telltale Parkinson's disease.  I would not recommend medication for Parkinson's disease in his case.  His mother reports that he has been wheelchair-bound for at least 4 years and had progressive problems since his neck surgery.  If you deem feasible, you can consider a neurosurgical consultation for cervical myelopathy.  However, exam indicates a chronic problem and longstanding history of immobility what with his thin muscle bulk and contractures and foot drop bilaterally.  Nevertheless, you can consider a neurosurgical opinion.  From my end of things I do not recommend medication for Parkinson's disease.  He is advised to follow-up with you on a regular basis, please continue with supportive care. Thank you very much for allowing me to participate in the care of this nice patient. If I can be of any further assistance to you please do not hesitate to call me at [phone].  Sincerely,   Huston Foley, MD, PhD

## 2019-03-01 NOTE — Patient Instructions (Addendum)
I do not see any evidence of parkinsonism.  I would not recommend any Parkinson's medication for you.  If you had problems with your neck and have had trouble walking since your neck surgery several years ago, it may be reasonable to consult with a spine surgeon or go back to your neurosurgeon from before.  Please talk to your primary care about a neurosurgical referral. You have lower extremity stiffness and contractures, indicating that you have a longer standing history of immobility, your mother reports that you have been bedbound for at least 3 years and wheelchair-bound for at least 4 years. Follow up with your primary care.

## 2019-03-04 ENCOUNTER — Inpatient Hospital Stay
Admission: AD | Admit: 2019-03-04 | Payer: Medicare Other | Source: Other Acute Inpatient Hospital | Admitting: Family Medicine

## 2019-03-06 ENCOUNTER — Inpatient Hospital Stay (HOSPITAL_COMMUNITY)
Admission: AD | Admit: 2019-03-06 | Discharge: 2019-03-09 | DRG: 871 | Disposition: A | Discharge status: Skilled Nursing Facility | Payer: Medicare Other | Source: Other Acute Inpatient Hospital | Attending: Family Medicine | Admitting: Family Medicine

## 2019-03-06 ENCOUNTER — Inpatient Hospital Stay (HOSPITAL_COMMUNITY): Payer: Medicare Other

## 2019-03-06 ENCOUNTER — Other Ambulatory Visit: Payer: Self-pay

## 2019-03-06 ENCOUNTER — Encounter (HOSPITAL_COMMUNITY): Payer: Self-pay

## 2019-03-06 DIAGNOSIS — I129 Hypertensive chronic kidney disease with stage 1 through stage 4 chronic kidney disease, or unspecified chronic kidney disease: Secondary | ICD-10-CM | POA: Diagnosis present

## 2019-03-06 DIAGNOSIS — G8220 Paraplegia, unspecified: Secondary | ICD-10-CM | POA: Diagnosis present

## 2019-03-06 DIAGNOSIS — N400 Enlarged prostate without lower urinary tract symptoms: Secondary | ICD-10-CM | POA: Diagnosis present

## 2019-03-06 DIAGNOSIS — N179 Acute kidney failure, unspecified: Secondary | ICD-10-CM | POA: Diagnosis present

## 2019-03-06 DIAGNOSIS — Z993 Dependence on wheelchair: Secondary | ICD-10-CM

## 2019-03-06 DIAGNOSIS — F209 Schizophrenia, unspecified: Secondary | ICD-10-CM | POA: Diagnosis present

## 2019-03-06 DIAGNOSIS — D631 Anemia in chronic kidney disease: Secondary | ICD-10-CM | POA: Diagnosis present

## 2019-03-06 DIAGNOSIS — F319 Bipolar disorder, unspecified: Secondary | ICD-10-CM | POA: Diagnosis present

## 2019-03-06 DIAGNOSIS — D696 Thrombocytopenia, unspecified: Secondary | ICD-10-CM | POA: Diagnosis present

## 2019-03-06 DIAGNOSIS — Z7401 Bed confinement status: Secondary | ICD-10-CM | POA: Diagnosis not present

## 2019-03-06 DIAGNOSIS — R7881 Bacteremia: Secondary | ICD-10-CM | POA: Diagnosis present

## 2019-03-06 DIAGNOSIS — E785 Hyperlipidemia, unspecified: Secondary | ICD-10-CM | POA: Diagnosis present

## 2019-03-06 DIAGNOSIS — L89312 Pressure ulcer of right buttock, stage 2: Secondary | ICD-10-CM | POA: Diagnosis present

## 2019-03-06 DIAGNOSIS — N183 Chronic kidney disease, stage 3 unspecified: Secondary | ICD-10-CM | POA: Diagnosis present

## 2019-03-06 DIAGNOSIS — Z66 Do not resuscitate: Secondary | ICD-10-CM | POA: Diagnosis present

## 2019-03-06 DIAGNOSIS — A419 Sepsis, unspecified organism: Secondary | ICD-10-CM

## 2019-03-06 DIAGNOSIS — A4151 Sepsis due to Escherichia coli [E. coli]: Principal | ICD-10-CM | POA: Diagnosis present

## 2019-03-06 DIAGNOSIS — U071 COVID-19: Secondary | ICD-10-CM | POA: Diagnosis present

## 2019-03-06 DIAGNOSIS — M4802 Spinal stenosis, cervical region: Secondary | ICD-10-CM | POA: Diagnosis present

## 2019-03-06 DIAGNOSIS — Z79899 Other long term (current) drug therapy: Secondary | ICD-10-CM

## 2019-03-06 DIAGNOSIS — L8990 Pressure ulcer of unspecified site, unspecified stage: Secondary | ICD-10-CM | POA: Diagnosis present

## 2019-03-06 DIAGNOSIS — R32 Unspecified urinary incontinence: Secondary | ICD-10-CM | POA: Diagnosis present

## 2019-03-06 DIAGNOSIS — N390 Urinary tract infection, site not specified: Secondary | ICD-10-CM | POA: Diagnosis present

## 2019-03-06 DIAGNOSIS — E870 Hyperosmolality and hypernatremia: Secondary | ICD-10-CM | POA: Diagnosis present

## 2019-03-06 DIAGNOSIS — N1831 Chronic kidney disease, stage 3a: Secondary | ICD-10-CM | POA: Diagnosis not present

## 2019-03-06 DIAGNOSIS — B9620 Unspecified Escherichia coli [E. coli] as the cause of diseases classified elsewhere: Secondary | ICD-10-CM | POA: Diagnosis present

## 2019-03-06 DIAGNOSIS — G40909 Epilepsy, unspecified, not intractable, without status epilepticus: Secondary | ICD-10-CM | POA: Diagnosis present

## 2019-03-06 DIAGNOSIS — Z87440 Personal history of urinary (tract) infections: Secondary | ICD-10-CM

## 2019-03-06 DIAGNOSIS — G992 Myelopathy in diseases classified elsewhere: Secondary | ICD-10-CM | POA: Diagnosis present

## 2019-03-06 DIAGNOSIS — L89151 Pressure ulcer of sacral region, stage 1: Secondary | ICD-10-CM | POA: Diagnosis present

## 2019-03-06 DIAGNOSIS — D638 Anemia in other chronic diseases classified elsewhere: Secondary | ICD-10-CM | POA: Diagnosis present

## 2019-03-06 DIAGNOSIS — R0902 Hypoxemia: Secondary | ICD-10-CM

## 2019-03-06 DIAGNOSIS — I10 Essential (primary) hypertension: Secondary | ICD-10-CM | POA: Diagnosis not present

## 2019-03-06 DIAGNOSIS — N289 Disorder of kidney and ureter, unspecified: Secondary | ICD-10-CM | POA: Diagnosis not present

## 2019-03-06 LAB — COMPREHENSIVE METABOLIC PANEL
ALT: 14 U/L (ref 0–44)
AST: 20 U/L (ref 15–41)
Albumin: 2.6 g/dL — ABNORMAL LOW (ref 3.5–5.0)
Alkaline Phosphatase: 83 U/L (ref 38–126)
Anion gap: 14 (ref 5–15)
BUN: 60 mg/dL — ABNORMAL HIGH (ref 8–23)
CO2: 22 mmol/L (ref 22–32)
Calcium: 9 mg/dL (ref 8.9–10.3)
Chloride: 112 mmol/L — ABNORMAL HIGH (ref 98–111)
Creatinine, Ser: 2.66 mg/dL — ABNORMAL HIGH (ref 0.61–1.24)
GFR calc Af Amer: 29 mL/min — ABNORMAL LOW (ref >60)
GFR calc non Af Amer: 25 mL/min — ABNORMAL LOW (ref >60)
Glucose, Bld: 110 mg/dL — ABNORMAL HIGH (ref 70–99)
Potassium: 3.5 mmol/L (ref 3.5–5.1)
Sodium: 148 mmol/L — ABNORMAL HIGH (ref 135–145)
Total Bilirubin: 0.9 mg/dL (ref 0.3–1.2)
Total Protein: 6.8 g/dL (ref 6.5–8.1)

## 2019-03-06 LAB — CBC WITH DIFFERENTIAL/PLATELET
Abs Immature Granulocytes: 0.2 10*3/uL — ABNORMAL HIGH (ref 0.00–0.07)
Basophils Absolute: 0.1 10*3/uL (ref 0.0–0.1)
Basophils Relative: 0 %
Eosinophils Absolute: 0 10*3/uL (ref 0.0–0.5)
Eosinophils Relative: 0 %
HCT: 41.2 % (ref 39.0–52.0)
Hemoglobin: 12.7 g/dL — ABNORMAL LOW (ref 13.0–17.0)
Immature Granulocytes: 1 %
Lymphocytes Relative: 11 %
Lymphs Abs: 1.5 10*3/uL (ref 0.7–4.0)
MCH: 30.6 pg (ref 26.0–34.0)
MCHC: 30.8 g/dL (ref 30.0–36.0)
MCV: 99.3 fL (ref 80.0–100.0)
Monocytes Absolute: 0.2 10*3/uL (ref 0.1–1.0)
Monocytes Relative: 1 %
Neutro Abs: 12.3 10*3/uL — ABNORMAL HIGH (ref 1.7–7.7)
Neutrophils Relative %: 87 %
Platelets: 132 10*3/uL — ABNORMAL LOW (ref 150–400)
RBC: 4.15 MIL/uL — ABNORMAL LOW (ref 4.22–5.81)
RDW: 17.1 % — ABNORMAL HIGH (ref 11.5–15.5)
WBC: 14.3 10*3/uL — ABNORMAL HIGH (ref 4.0–10.5)
nRBC: 0.1 % (ref 0.0–0.2)

## 2019-03-06 LAB — HIV ANTIBODY (ROUTINE TESTING W REFLEX): HIV Screen 4th Generation wRfx: NONREACTIVE

## 2019-03-06 LAB — D-DIMER, QUANTITATIVE: D-Dimer, Quant: 0.68 ug{FEU}/mL — ABNORMAL HIGH (ref 0.00–0.50)

## 2019-03-06 LAB — PROCALCITONIN: Procalcitonin: 1.02 ng/mL

## 2019-03-06 LAB — ABO/RH: ABO/RH(D): O POS

## 2019-03-06 LAB — C-REACTIVE PROTEIN: CRP: 1.8 mg/dL — ABNORMAL HIGH (ref <1.0)

## 2019-03-06 MED ORDER — KCL IN DEXTROSE-NACL 10-5-0.45 MEQ/L-%-% IV SOLN
INTRAVENOUS | Status: AC
  Administered 2019-03-06 – 2019-03-08 (×4): via INTRAVENOUS
  Filled 2019-03-06 (×6): qty 1000

## 2019-03-06 MED ORDER — ENOXAPARIN SODIUM 30 MG/0.3ML ~~LOC~~ SOLN
30.0000 mg | SUBCUTANEOUS | Status: DC
  Administered 2019-03-06 – 2019-03-08 (×3): 30 mg via SUBCUTANEOUS
  Filled 2019-03-06 (×3): qty 0.3

## 2019-03-06 MED ORDER — DOCUSATE SODIUM 100 MG PO CAPS
100.0000 mg | ORAL_CAPSULE | Freq: Two times a day (BID) | ORAL | Status: DC
  Administered 2019-03-07 (×2): 100 mg via ORAL
  Filled 2019-03-06 (×3): qty 1

## 2019-03-06 MED ORDER — ZINC SULFATE 220 (50 ZN) MG PO CAPS
220.0000 mg | ORAL_CAPSULE | Freq: Every day | ORAL | Status: DC
  Administered 2019-03-06 – 2019-03-09 (×4): 220 mg via ORAL
  Filled 2019-03-06 (×4): qty 1

## 2019-03-06 MED ORDER — GABAPENTIN 100 MG PO CAPS
100.0000 mg | ORAL_CAPSULE | Freq: Three times a day (TID) | ORAL | Status: DC | PRN
  Administered 2019-03-07: 100 mg via ORAL
  Filled 2019-03-06 (×2): qty 1

## 2019-03-06 MED ORDER — LEVETIRACETAM 250 MG PO TABS
500.0000 mg | ORAL_TABLET | Freq: Two times a day (BID) | ORAL | Status: DC
  Administered 2019-03-06 – 2019-03-09 (×6): 500 mg via ORAL
  Filled 2019-03-06 (×6): qty 2

## 2019-03-06 MED ORDER — LORAZEPAM 2 MG/ML IJ SOLN: 0.5000 mg | Freq: Four times a day (QID) | INTRAMUSCULAR | Status: DC | PRN

## 2019-03-06 MED ORDER — SODIUM CHLORIDE 0.9% FLUSH
3.0000 mL | Freq: Two times a day (BID) | INTRAVENOUS | Status: DC
  Administered 2019-03-06 – 2019-03-08 (×5): 3 mL via INTRAVENOUS

## 2019-03-06 MED ORDER — BENZTROPINE MESYLATE 1 MG PO TABS
1.0000 mg | ORAL_TABLET | Freq: Two times a day (BID) | ORAL | Status: DC
  Administered 2019-03-06 – 2019-03-09 (×6): 1 mg via ORAL
  Filled 2019-03-06 (×8): qty 1

## 2019-03-06 MED ORDER — SODIUM CHLORIDE 0.9 % IV SOLN
2.0000 g | INTRAVENOUS | Status: DC
  Administered 2019-03-06 – 2019-03-08 (×3): 2 g via INTRAVENOUS
  Filled 2019-03-06 (×3): qty 20

## 2019-03-06 MED ORDER — TAMSULOSIN HCL 0.4 MG PO CAPS
0.4000 mg | ORAL_CAPSULE | Freq: Every day | ORAL | Status: DC
  Administered 2019-03-07 – 2019-03-09 (×3): 0.4 mg via ORAL
  Filled 2019-03-06 (×3): qty 1

## 2019-03-06 MED ORDER — PROPYLENE GLYCOL 0.6 % OP SOLN: 1.0000 [drp] | Freq: Every day | OPHTHALMIC | Status: DC

## 2019-03-06 MED ORDER — POLYVINYL ALCOHOL 1.4 % OP SOLN
1.0000 [drp] | Freq: Every day | OPHTHALMIC | Status: DC
  Administered 2019-03-06 – 2019-03-09 (×4): 1 [drp] via OPHTHALMIC
  Filled 2019-03-06: qty 15

## 2019-03-06 MED ORDER — DIVALPROEX SODIUM 500 MG PO DR TAB
750.0000 mg | DELAYED_RELEASE_TABLET | Freq: Every day | ORAL | Status: DC
  Administered 2019-03-07 – 2019-03-09 (×3): 750 mg via ORAL
  Filled 2019-03-06 (×4): qty 1

## 2019-03-06 MED ORDER — PERPHENAZINE 2 MG PO TABS
2.0000 mg | ORAL_TABLET | Freq: Two times a day (BID) | ORAL | Status: DC
  Administered 2019-03-06 – 2019-03-09 (×6): 2 mg via ORAL
  Filled 2019-03-06 (×8): qty 1

## 2019-03-06 MED ORDER — HYDROCOD POLST-CPM POLST ER 10-8 MG/5ML PO SUER: 5.0000 mL | Freq: Two times a day (BID) | ORAL | Status: DC | PRN

## 2019-03-06 MED ORDER — BIOTENE DRY MOUTH MT LIQD: 15.0000 mL | OROMUCOSAL | Status: DC | PRN

## 2019-03-06 MED ORDER — LORAZEPAM 0.5 MG PO TABS: 0.5000 mg | ORAL_TABLET | Freq: Four times a day (QID) | ORAL | Status: DC | PRN

## 2019-03-06 MED ORDER — ATORVASTATIN CALCIUM 40 MG PO TABS
40.0000 mg | ORAL_TABLET | Freq: Every day | ORAL | Status: DC
  Administered 2019-03-06 – 2019-03-08 (×3): 40 mg via ORAL
  Filled 2019-03-06 (×3): qty 1

## 2019-03-06 MED ORDER — SODIUM CHLORIDE 0.9% FLUSH: 3.0000 mL | INTRAVENOUS | Status: DC | PRN

## 2019-03-06 MED ORDER — ONDANSETRON HCL 4 MG PO TABS: 4.0000 mg | ORAL_TABLET | Freq: Four times a day (QID) | ORAL | Status: DC | PRN

## 2019-03-06 MED ORDER — SODIUM CHLORIDE 0.9 % IV SOLN: 250.0000 mL | INTRAVENOUS | Status: DC | PRN

## 2019-03-06 MED ORDER — POLYETHYLENE GLYCOL 3350 17 G PO PACK
17.0000 g | PACK | Freq: Every day | ORAL | Status: DC
  Administered 2019-03-07: 17 g via ORAL
  Filled 2019-03-06: qty 1

## 2019-03-06 MED ORDER — DIVALPROEX SODIUM 500 MG PO DR TAB
1000.0000 mg | DELAYED_RELEASE_TABLET | Freq: Every day | ORAL | Status: DC
  Administered 2019-03-06 – 2019-03-08 (×3): 1000 mg via ORAL
  Filled 2019-03-06 (×4): qty 2

## 2019-03-06 MED ORDER — VITAMIN C 500 MG PO TABS
500.0000 mg | ORAL_TABLET | Freq: Every day | ORAL | Status: DC
  Administered 2019-03-06 – 2019-03-09 (×4): 500 mg via ORAL
  Filled 2019-03-06 (×4): qty 1

## 2019-03-06 MED ORDER — ALBUTEROL SULFATE HFA 108 (90 BASE) MCG/ACT IN AERS
2.0000 | INHALATION_SPRAY | Freq: Four times a day (QID) | RESPIRATORY_TRACT | Status: DC | PRN
  Filled 2019-03-06: qty 6.7

## 2019-03-06 MED ORDER — GUAIFENESIN-DM 100-10 MG/5ML PO SYRP: 10.0000 mL | ORAL_SOLUTION | ORAL | Status: DC | PRN

## 2019-03-06 MED ORDER — ENOXAPARIN SODIUM 40 MG/0.4ML ~~LOC~~ SOLN: 40.0000 mg | SUBCUTANEOUS | Status: DC

## 2019-03-06 MED ORDER — ACETAMINOPHEN 325 MG PO TABS
650.0000 mg | ORAL_TABLET | Freq: Four times a day (QID) | ORAL | Status: DC | PRN
  Administered 2019-03-07 – 2019-03-09 (×2): 650 mg via ORAL
  Filled 2019-03-06 (×2): qty 2

## 2019-03-06 MED ORDER — ONDANSETRON HCL 4 MG/2ML IJ SOLN: 4.0000 mg | Freq: Four times a day (QID) | INTRAMUSCULAR | Status: DC | PRN

## 2019-03-06 NOTE — H&P (Signed)
History and Physical   [name] [name]:[id number] DOB: [birth year] DOA: 03/06/2019  Referring MD/NP/PA: Dr. Lanny Hurst, EDP  PCP: [name], MD  Patient coming from: Long term care facility by way of [facility] ED  Chief Complaint: "not responsive to verbal stimuli"  HPI: Jamie Drake is a 63 y.o. male with a history of spinal stenosis s/p neck surgery, bedbound, seizure disorder, bipolar disorder, schizophrenia, stage III CKD, HTN, HLD, anemia who was brought from long term care facility for being "nonresponsive to verbal stimuli." History is otherwise limited from the patient due to confusion.   On presentation he was febrile to 102F, HR 127bpm, 118/63, saturating 96% breathing 18/min room air. HR decreased with IV fluids. Urinalysis suggested recurrent UTI. Admission requested to continue treatment of sepsis due to recurrent UTI. Antibiotics started. SARS-CoV-2 PCR was positive, so admission requested at [facility] where the patient was accepted and awaited transfer for approximately 48 hours. He was not hypoxic with CXR findings of subtle LLL opacity, no multifocal infiltrates. There have been no documented episodes of hypoxia, though he arrives with supplemental oxygen on. This was taken off upon beginning the admission encounter.   Note all medications were confirmed against MAR provided by facility.    Review of Systems: Denies chest pain, abdominal pain, shortness of breath, cough, N/V/D, rashes, myalgias, arthralgias, and per HPI. All others reviewed as much as patient would answer and are negative.   PMH: CKD, anemia, schizophrenia, mood disorder, seizure disorder PSH: Neck surgery NOS SH: Lives at long term care facility, was essentially wheelchair bound for years, not mostly just in the bed. No EtOH or smoking currently.  NKDA FH: Reviewed and not pertinent to pt's recollection  Prior to Admission medications   Medication Sig Start Date End Date Taking?  Authorizing Provider  acetaminophen (TYLENOL) 325 MG tablet Take 650 mg by mouth every 6 (six) hours as needed.    [provider]  antiseptic oral rinse (BIOTENE) LIQD 15 mLs by Mouth Rinse route as needed for dry mouth.    [provider]  ascorbic acid (VITAMIN C) 500 MG tablet Take 500 mg by mouth daily.    [provider]  atorvastatin (LIPITOR) 40 MG tablet Take 40 mg by mouth daily. 10/14/18   [provider]  benztropine (COGENTIN) 1 MG tablet Take 1 mg by mouth 2 (two) times a day. 10/19/18   [provider]  divalproex (DEPAKOTE) 250 MG DR tablet Take 750 mg by mouth daily.  10/20/18   [provider]  divalproex (DEPAKOTE) 500 MG DR tablet Take 1,000 mg by mouth at bedtime. 10/09/18   [provider]  docusate sodium (COLACE) 100 MG capsule Take 100 mg by mouth 2 (two) times daily.    [provider]  gabapentin (NEURONTIN) 100 MG capsule Take 100 mg by mouth 3 (three) times daily as needed for pain. 10/10/18   [provider]  metoprolol succinate (TOPROL-XL) 25 MG 24 hr tablet Take 12.5 mg by mouth 2 (two) times a day.    [provider]  Multiple Vitamin (MULTIVITAMIN) tablet Take 1 tablet by mouth daily.    [provider]  Omega-3 Fatty Acids (FISH OIL) 1000 MG CAPS Take 1,000 mg by mouth daily.    [provider]  ondansetron (ZOFRAN) 4 MG tablet Take 4 mg by mouth every 6 (six) hours as needed for nausea or vomiting.    [provider]  perphenazine (TRILAFON) 2 MG  tablet Take 2 mg by mouth daily. 09/28/18   [provider]  Propylene Glycol (SYSTANE BALANCE) 0.6 % SOLN Apply 1 drop to eye daily.    [provider]  tamsulosin (FLOMAX) 0.4 MG CAPS capsule Take 0.4 mg by mouth daily. 09/14/18   [provider]  vitamin E 400 UNIT capsule Take 400 Units by mouth daily.    [provider]    Physical Exam: Vitals:   03/06/19 1530  BP:  106/71  Pulse: 68  Resp: 18  Temp: 97.8 F (36.6 C)  TempSrc: Oral  SpO2: 100%   Constitutional: Frail male in no distress, calm demeanor Eyes: Lids and conjunctivae normal, PERRL ENMT: Mucous membranes are tacky. Posterior pharynx clear of any exudate or lesions. Poor dentition.  Neck: normal, supple, no masses, no thyromegaly Respiratory: Non-labored breathing room air, remaining 100% off oxygen without accessory muscle use. Clear breath sounds to auscultation bilaterally. Deep inspiration with good aeration and no cough provoked. Cardiovascular: Regular rate and rhythm, no murmurs, rubs, or gallops. No carotid bruits. No JVD. No LE edema. Palpable pedal pulses. Abdomen: Normoactive bowel sounds. No tenderness, non-distended, and no masses palpated. No hepatosplenomegaly. GU: No indwelling catheter Musculoskeletal: Atrophied diffusely LEs > UEs. Rigid RUE and LE's, limited ROM Skin: Warm, dry. sacral and coccyx and buttock wounds. Neurologic: CN II-XII grossly intact. Bilateral foot drop, symmetrically decreased strength in bilateral legs.  Psychiatric: Alert, oriented to person and his birthday only. Impaired insight.  Labs on Admission: I have personally reviewed and summarized labs performed at OSH above.   Radiological Exams on Admission: Very subtle LLL opacity ?atelectasis, pulmonary vasculature is prominent.  EKG: Independently reviewed. from admission showing sinus tachycardia, with LAFB, no ST segment deviations. aVL with isolated inverted T wave. QTc .  Assessment/Plan Active Problems:   AKI (acute kidney injury) (HCC)   COVID-19   Renal insufficiency   Sepsis due to urinary tract infection (HCC)   Sepsis due to E. coli UTI: Febrile, tachycardic, neutrophilic leukocytosis at 22.8k > 13.8k. PCT 1.48 > 0.90, CRP elevated at 67.6 slightly less specific. Lactic acid reassuringly 1.6. UA showed cloudy urine SpGrav 1.010 with TNTC RBC, WBC, and 4+ bacteria. Troponin  negative x2 in ED - Urine culture from 10/18 shows resistance only to ciprofloxacin, intermediate levofloxacin, negative ESBL.  - Blood cultures NGTD at 24hrs. will continue to follow  - Repeat UA to confirm clearance of bacteriuria since.  - Repeat labs. Noted repeat on 10/19 showed improvement in leukocytosis down to 13.8k - Continue flomax due to BPH, condom catheter due to incontinence - Appears vancomycin 1500mg  given x2, in addition to zosyn and ceftriaxone. Will give ceftriaxone alone for now.   LLL infiltrate: Unclear whether this is clinically salient at this point in the absence of pulmonary signs or symptoms.  - Repeat CXR - Could consider adding anaerobic coverage as he's at risk of aspiration, though as discussed above, it appears bacterial pneumonia is unlikely at this time. Will have low threshold to start.  - Note MRSA PCR was positive and pt in congregate living facility, though this presentation not typical of MRSA PNA, so would not continue coverage at this time.   Covid-19 positive: Pt has sepsis, most attributable to UTI. Tested positive 10/18 after ~4 months since last known positive. He was admitted here in June 2020 and required no directed treatment at that time. Very unclear case of sustained viral shedding vs. reinfection which is possible in a congregate living  facility. Regardless, would be candidate for treatment if indicated.  - Check inflammatory markers, repeat CXR.  - Taken off oxygen on admission and has remained without tachypnea, dyspnea, or SpO2 below 98%.  - Continue airborne, contact precautions. PPE including surgical gown, gloves, cap, shoe covers, and CAPR used during this encounter in a negative pressure room.  - Check daily labs: CBC w/diff, CMP, d-dimer, ferritin, CRP  - Enoxaparin prophylactic dose.   - Vitamin C, zinc - Antitussives in case this is necessary, inhaler prn.  - Avoid NSAIDs - Recommend incentive spirometry, instructed patient who  pulled ~900cc.  - Was given decadron 10mg  IV, which we will defer continuing for now  Stage II pressure injury to right buttocks and stage 1 on sacrum coccyx: POA. - Offload as able - Optimize nutrition - Mepilex applied.  Bipolar disorder, schizophrenia, , seizure disorder, cervical stenosis with chronic myelopathy, paraplegia: Note pt assessed by Dr. Frances FurbishAthar, Neurology, as outpatient who did not believe he is demonstrating symptoms of Parkinson's Disease, so no sinemet given. - Continue home depakote 750mg  qAM, 1g qPM, level nearly normal at 46.3.  - Continue cogentin 1mg  po BID, - Continue perphenazine 2mg  po BID - Continue gabapentin 100mg  po TID prn with plan to switch back to scheduled if CrCl allows (pending) - Continue keppra 500mg  po BID - prn ativan - Delirium precautions - Aspiration precautions, dysphagia 3 diet per previous admission  HTN:  - Hold metoprolol 12.5mg  q12h since BP has been low, currently normotensive  Hypernatremia: Na 152. Suspect hemoconcentration on that lab draw.  - Monitor after IV fluids, check now.   AKI on stage III CKD: Cr 2.50 > 2.40 in ED. Baseline presumed to be ~1.6.  - Do not continue vancomycin or other nephrotoxins - Recheck Cr since fluids have been given. If still elevated, would likely restart IVF overnight.   Hyperlipidemia:  - Continue Lipitor 40mg   DVT prophylaxis: Lovenox 30mg  q24h w/low CrCl, body weight, and anemia  Code Status: DNR  Family Communication: None at bedside. Listed number for Glennis Brinkllen Howell [phone]) is not in service. Disposition Plan: Return to facility pending clinical outcome Consults called: None  Admission status: Inpatient    Tyrone Nineyan B Velena Keegan, MD [facility] [email or web address] Password [email or web address] 03/06/2019, 6:08 PM

## 2019-03-06 NOTE — Progress Notes (Signed)
Stage II R buttock, Blanchable redness medial sacrum

## 2019-03-07 LAB — MAGNESIUM: Magnesium: 2.4 mg/dL (ref 1.7–2.4)

## 2019-03-07 LAB — COMPREHENSIVE METABOLIC PANEL
ALT: 14 U/L (ref 0–44)
AST: 23 U/L (ref 15–41)
Albumin: 2.5 g/dL — ABNORMAL LOW (ref 3.5–5.0)
Alkaline Phosphatase: 82 U/L (ref 38–126)
Anion gap: 14 (ref 5–15)
BUN: 56 mg/dL — ABNORMAL HIGH (ref 8–23)
CO2: 19 mmol/L — ABNORMAL LOW (ref 22–32)
Calcium: 8.9 mg/dL (ref 8.9–10.3)
Chloride: 111 mmol/L (ref 98–111)
Creatinine, Ser: 2.25 mg/dL — ABNORMAL HIGH (ref 0.61–1.24)
GFR calc Af Amer: 35 mL/min — ABNORMAL LOW (ref >60)
GFR calc non Af Amer: 30 mL/min — ABNORMAL LOW (ref >60)
Glucose, Bld: 132 mg/dL — ABNORMAL HIGH (ref 70–99)
Potassium: 3.9 mmol/L (ref 3.5–5.1)
Sodium: 144 mmol/L (ref 135–145)
Total Bilirubin: 0.3 mg/dL (ref 0.3–1.2)
Total Protein: 6.4 g/dL — ABNORMAL LOW (ref 6.5–8.1)

## 2019-03-07 LAB — CBC WITH DIFFERENTIAL/PLATELET
Abs Immature Granulocytes: 0.13 10*3/uL — ABNORMAL HIGH (ref 0.00–0.07)
Basophils Absolute: 0 10*3/uL (ref 0.0–0.1)
Basophils Relative: 0 %
Eosinophils Absolute: 0 10*3/uL (ref 0.0–0.5)
Eosinophils Relative: 0 %
HCT: 40 % (ref 39.0–52.0)
Hemoglobin: 12.1 g/dL — ABNORMAL LOW (ref 13.0–17.0)
Immature Granulocytes: 1 %
Lymphocytes Relative: 23 %
Lymphs Abs: 2.6 10*3/uL (ref 0.7–4.0)
MCH: 30.4 pg (ref 26.0–34.0)
MCHC: 30.3 g/dL (ref 30.0–36.0)
MCV: 100.5 fL — ABNORMAL HIGH (ref 80.0–100.0)
Monocytes Absolute: 0.5 10*3/uL (ref 0.1–1.0)
Monocytes Relative: 5 %
Neutro Abs: 8 10*3/uL — ABNORMAL HIGH (ref 1.7–7.7)
Neutrophils Relative %: 71 %
Platelets: 103 10*3/uL — ABNORMAL LOW (ref 150–400)
RBC: 3.98 MIL/uL — ABNORMAL LOW (ref 4.22–5.81)
RDW: 16.7 % — ABNORMAL HIGH (ref 11.5–15.5)
WBC: 11.3 10*3/uL — ABNORMAL HIGH (ref 4.0–10.5)
nRBC: 0 % (ref 0.0–0.2)

## 2019-03-07 LAB — C-REACTIVE PROTEIN: CRP: 1.1 mg/dL — ABNORMAL HIGH (ref <1.0)

## 2019-03-07 LAB — D-DIMER, QUANTITATIVE: D-Dimer, Quant: 0.52 ug/mL-FEU — ABNORMAL HIGH (ref 0.00–0.50)

## 2019-03-07 LAB — FERRITIN: Ferritin: 252 ng/mL (ref 24–336)

## 2019-03-07 MED ORDER — ENSURE ENLIVE PO LIQD
237.0000 mL | Freq: Three times a day (TID) | ORAL | Status: DC
  Administered 2019-03-07 – 2019-03-09 (×6): 237 mL via ORAL

## 2019-03-07 MED ORDER — ADULT MULTIVITAMIN W/MINERALS CH
1.0000 | ORAL_TABLET | Freq: Every day | ORAL | Status: DC
  Administered 2019-03-07 – 2019-03-09 (×3): 1 via ORAL
  Filled 2019-03-07 (×3): qty 1

## 2019-03-07 NOTE — Progress Notes (Addendum)
Initial Nutrition Assessment RD working remotely.  DOCUMENTATION CODES:   Not applicable  INTERVENTION:    Ensure Enlive po TID, each supplement provides 350 kcal and 20 grams of protein.  MVI with minerals daily.  Pt receiving Hormel Shake daily with Breakfast which provides 520 kcals and 22 g of protein and Magic cup BID with lunch and dinner, each supplement provides 290 kcal and 9 grams of protein, automatically on meal trays to optimize nutritional intake.   NUTRITION DIAGNOSIS:   Increased nutrient needs related to acute illness, wound healing(COVID-19) as evidenced by estimated needs.  GOAL:   Patient will meet greater than or equal to 90% of their needs  MONITOR:   PO intake, Supplement acceptance, Skin  REASON FOR ASSESSMENT:   Consult Assessment of nutrition requirement/status, Wound healing  ASSESSMENT:   63 yo male admitted from SNF due to being not responsive to verbal stimuli, fever, UTI, COVID positive. PMH includes spinal stenosis, bed bound, seizure D/O, bipolar D/O, schizophrenia, CKD stage 3, HTN, HLD, anemia.   No one answered when room number called. Unable to obtain any nutrition hx from patient at this time.   Currently on a dysphagia 3 diet with thin liquids. He has consumed 25-60% of meals since admission. Not meeting increased nutrition needs with current intake of meals.  Patient is at increased nutrition risk, given poor intake with acute increased nutrient needs and poor intake. Will add PO supplement and MVI.  Labs reviewed. Sodium 148 (H), BUN 60 (H), creatinine 2.66 (H)  Medications reviewed and include colace, miralax, flomax, vitamin C, zinc sulfate.  IVF: D5 1/2 NS with KCl 10 mEq/L at 100 ml/h  NUTRITION - FOCUSED PHYSICAL EXAM:  deferred  Diet Order:   Diet Order            DIET DYS 3 Room service appropriate? Yes; Fluid consistency: Thin  Diet effective now              EDUCATION NEEDS:   Not appropriate for  education at this time  Skin:  Skin Assessment: Skin Integrity Issues: Skin Integrity Issues:: Stage II Stage II: R buttocks  Last BM:  10/21  Height:   Ht Readings from Last 1 Encounters:  03/06/19 5\' 10"  (1.778 m)    Weight:   Wt Readings from Last 1 Encounters:  03/06/19 59.8 kg    Ideal Body Weight:  75.5 kg  BMI:  Body mass index is 18.91 kg/m.  Estimated Nutritional Needs:   Kcal:  1900-2100  Protein:  90-110 gm  Fluid:  >/= 1.8 L    Molli Barrows, RD, LDN, [place] Pager [phone] After Hours Pager [phone]

## 2019-03-07 NOTE — Progress Notes (Addendum)
PROGRESS NOTE  Jamie Drake  VQQ:[id number] DOB: 02/27/1956 DOA: 03/06/2019 PCP: [name], MD  Brief Narrative: Jamie Drake is a 63 y.o. male with a history of spinal stenosis s/p neck surgery, bedbound, seizure disorder, bipolar disorder, schizophrenia, stage III CKD, HTN, HLD, anemia who was brought from long term care facility to [facility] ED on 10/18 for being "nonresponsive to verbal stimuli" and was found to have sepsis with E. coli UTI. He had been admitted in June 2020 for covid-19 positivity but never required treatment and again tested positive, prompting admission request at [facility]. Antibiotics given with improvement in vital signs and labs. CXR showed subtle left base density, scarring and atelectasis vs. infiltrate, though he did not have documented hypoxia. Ceftriaxone has been continued.   Assessment & Plan: Active Problems:   AKI (acute kidney injury) ([place])   COVID-19   Renal insufficiency   Sepsis due to urinary tract infection ([place])  Sepsis due to E. coli UTI: Febrile, tachycardic, neutrophilic leukocytosis at 43.3I > 13.8k. PCT 1.48 > 0.90, CRP elevated at 67.6 slightly less specific. Lactic acid reassuringly 1.6. UA showed cloudy urine SpGrav 1.010 with TNTC RBC, WBC, and 4+ bacteria. Troponin negative x2 in ED - All inflammatory markers including leukocytosis improving with fluids, abx.  - Urine culture from 10/18 shows resistance only to ciprofloxacin, intermediate levofloxacin, negative ESBL. Continue ceftriaxone 2g IV q24h - Blood cultures NGTD at 24hrs on admission to [facility]. Will check for update today.  - Continue flomax due to BPH, condom catheter due to incontinence  LLL infiltrate: Unclear whether this is clinically salient at this point in the absence of pulmonary signs or symptoms. Appears stable from prior CXR on repeat 10/20.  - Coninue ceftriaxone as aboe. Could consider adding anaerobic coverage as he's at risk of aspiration, though as discussed  above, it appears bacterial pneumonia is unlikely at this time. Will have low threshold to start.  - Note MRSA PCR was positive and pt in congregate living facility, though this presentation not typical of MRSA PNA, so would not continue coverage at this time.   Covid-19 positive: Pt has sepsis, most attributable to UTI. Tested positive 10/18 after ~4 months since last known positive. He was admitted here in June 2020 and required no directed treatment at that time. Very unclear case of sustained viral shedding vs. reinfection which is possible in a congregate living facility. Regardless, would be candidate for treatment if indicated.  - CXR not showing typical covid multifocal opacities, CRP minimally elevated with concomitant PCT elevation not consistent with viral cytokine storm. Not on oxygen. Will not start steroids or remdesivir at this time.  - Continue airborne, contact precautions. PPE including surgical gown, gloves, cap, shoe covers, and CAPR used during this encounter in a negative pressure room.  - Check daily labs: CBC w/diff, CMP, d-dimer, ferritin, CRP  - Enoxaparin prophylactic dose.   - Vitamin C, zinc - Antitussives in case this is necessary, inhaler prn.  - Avoid NSAIDs - Recommend incentive spirometry, instructed patient who pulled ~900cc.  - Was given decadron 10mg  IV, which we will defer continuing for now  Stage II pressure injury to right buttocks and stage 1 on sacrum coccyx: POA. - Offload as able - Optimize nutrition - Mepilex applied.  Bipolar disorder, schizophrenia, , seizure disorder, cervical stenosis with chronic myelopathy, paraplegia: Note pt assessed by Dr. Rexene Alberts, Neurology, as outpatient who did not believe he is demonstrating symptoms of Parkinson's Disease, so no sinemet  given. - Continue home depakote 750mg  qAM, 1g qPM, level nearly normal at 46.3.  - Continue cogentin 1mg  po BID, - Continue perphenazine 2mg  po BID - Continue gabapentin 100mg  po TID  prn with plan to switch back to scheduled if CrCl allows (pending) - Continue keppra 500mg  po BID - prn ativan - Delirium precautions - Aspiration precautions, dysphagia 3 diet per previous admission  HTN:  - Hold metoprolol 12.5mg  q12h since BP has been low, currently normotensive  Hypernatremia: Na 152, since improved to 144 with IVF.  - Continue IVF while taking limited po.  AKI on stage III CKD: Cr elevated on admission, remains elevated but improved with IVF. Baseline presumed to be ~1.6.  - Continue IVF, renally dose medications as appropriate. - Do not continue vancomycin or other nephrotoxins  Hyperlipidemia:  - Continue Lipitor 40mg   DVT prophylaxis: Lovenox 30mg  q24h Code Status: DNR Family Communication: None at bedside, spoke with mother by phone at length today. Disposition Plan: Return to facility once clinically improved, renal function improved, and no development of hypoxia necessitating covid treatment  Consultants:   None  Procedures:   None  Antimicrobials:  Ceftriaxone 10/18 >>    Subjective: Confused, no complaints this AM. Specifically denies shortness of breath or cough.  Objective: Vitals:   03/06/19 1924 03/06/19 2141 03/07/19 0539 03/07/19 0723  BP: (!) 87/64  95/81 91/71  Pulse: 72   (!) 50  Resp: 14  16 16   Temp: 98 F (36.7 C)  98.5 F (36.9 C) (!) 97.5 F (36.4 C)  TempSrc: Oral  Oral Oral  SpO2: 96%  100% 100%  Weight:  59.8 kg    Height:  5\' 10"  (1.778 m)      Intake/Output Summary (Last 24 hours) at 03/07/2019 0915 Last data filed at 03/07/2019 11/18 Gross per 24 hour  Intake 1731.74 ml  Output 600 ml  Net 1131.74 ml   Filed Weights   03/06/19 2141  Weight: 59.8 kg    Gen: 63 y.o. male in no distress, frail Pulm: Non-labored breathing room air with normal rate. Clear to auscultation bilaterally.  CV: Regular rate and rhythm. No murmur, rub, or gallop. No JVD, no pedal edema. GI: Abdomen soft, non-tender,  non-distended, with normoactive bowel sounds. No organomegaly or masses felt. Ext: Warm, atrophied Skin: No rashes, lesions or ulcers on visualized skin. Sacral/back exam deferred. Neuro: Alert, not completely oriented. No tremor, +rigidity/spasticity Psych: Judgement and insight appear impaired. Mood & affect appropriate.   Data Reviewed: I have personally reviewed following labs and imaging studies  CBC: Recent Labs  Lab 03/06/19 1654 03/07/19 0150  WBC 14.3* 11.3*  NEUTROABS 12.3* 8.0*  HGB 12.7* 12.1*  HCT 41.2 40.0  MCV 99.3 100.5*  PLT 132* 103*   Basic Metabolic Panel: Recent Labs  Lab 03/06/19 1654 03/07/19 0150  NA 148* 144  K 3.5 3.9  CL 112* 111  CO2 22 19*  GLUCOSE 110* 132*  BUN 60* 56*  CREATININE 2.66* 2.25*  CALCIUM 9.0 8.9  MG  --  2.4   GFR: Estimated Creatinine Clearance: 29.2 mL/min (A) (by C-G formula based on SCr of 2.25 mg/dL (H)). Liver Function Tests: Recent Labs  Lab 03/06/19 1654 03/07/19 0150  AST 20 23  ALT 14 14  ALKPHOS 83 82  BILITOT 0.9 0.3  PROT 6.8 6.4*  ALBUMIN 2.6* 2.5*   Anemia Panel: Recent Labs    03/07/19 0150  FERRITIN 252   Radiology Studies: Dg Chest 1  View  Result Date: 03/06/2019 CLINICAL DATA:  Shortness of breath. EXAM: CHEST  1 VIEW COMPARISON:  03/04/2019 FINDINGS: Heart size and vascularity are normal. Persistent faint increased density at the left lung base with slight atelectasis or scarring at the left lung base. The lungs are otherwise clear. No bone abnormality. IMPRESSION: 1. No significant change since the prior study. 2. Persistent faint density at the left lung base. Electronically Signed   By: Francene BoyersJames  Maxwell M.D.   On: 03/06/2019 19:29    Scheduled Meds: . atorvastatin  40 mg Oral q1800  . benztropine  1 mg Oral BID  . divalproex  1,000 mg Oral QHS  . divalproex  750 mg Oral Daily  . docusate sodium  100 mg Oral BID  . enoxaparin (LOVENOX) injection  30 mg Subcutaneous Q24H  .  levETIRAcetam  500 mg Oral BID  . perphenazine  2 mg Oral BID  . polyethylene glycol  17 g Oral Daily  . polyvinyl alcohol  1 drop Both Eyes Daily  . sodium chloride flush  3 mL Intravenous Q12H  . tamsulosin  0.4 mg Oral Daily  . vitamin C  500 mg Oral Daily  . zinc sulfate  220 mg Oral Daily   Continuous Infusions: . sodium chloride    . cefTRIAXone (ROCEPHIN)  IV 2 g (03/06/19 1755)  . dextrose 5 % and 0.45 % NaCl with KCl 10 mEq/L 100 mL/hr at 03/07/19 0545     LOS: 1 day   Time spent: 35 minutes.  Tyrone Nineyan B Christiaan Strebeck, MD [facility] [email or web address] Password [email or web address] 03/07/2019, 9:15 AM

## 2019-03-08 DIAGNOSIS — B9620 Unspecified Escherichia coli [E. coli] as the cause of diseases classified elsewhere: Secondary | ICD-10-CM

## 2019-03-08 DIAGNOSIS — I10 Essential (primary) hypertension: Secondary | ICD-10-CM

## 2019-03-08 DIAGNOSIS — E785 Hyperlipidemia, unspecified: Secondary | ICD-10-CM | POA: Diagnosis present

## 2019-03-08 DIAGNOSIS — N183 Chronic kidney disease, stage 3 unspecified: Secondary | ICD-10-CM | POA: Diagnosis present

## 2019-03-08 DIAGNOSIS — N1831 Chronic kidney disease, stage 3a: Secondary | ICD-10-CM

## 2019-03-08 DIAGNOSIS — D638 Anemia in other chronic diseases classified elsewhere: Secondary | ICD-10-CM

## 2019-03-08 DIAGNOSIS — E870 Hyperosmolality and hypernatremia: Secondary | ICD-10-CM | POA: Diagnosis present

## 2019-03-08 DIAGNOSIS — R7881 Bacteremia: Secondary | ICD-10-CM | POA: Diagnosis present

## 2019-03-08 LAB — RENAL FUNCTION PANEL
Albumin: 2.2 g/dL — ABNORMAL LOW (ref 3.5–5.0)
Anion gap: 10 (ref 5–15)
BUN: 38 mg/dL — ABNORMAL HIGH (ref 8–23)
CO2: 20 mmol/L — ABNORMAL LOW (ref 22–32)
Calcium: 8.8 mg/dL — ABNORMAL LOW (ref 8.9–10.3)
Chloride: 115 mmol/L — ABNORMAL HIGH (ref 98–111)
Creatinine, Ser: 1.74 mg/dL — ABNORMAL HIGH (ref 0.61–1.24)
GFR calc Af Amer: 48 mL/min — ABNORMAL LOW (ref >60)
GFR calc non Af Amer: 41 mL/min — ABNORMAL LOW (ref >60)
Glucose, Bld: 97 mg/dL (ref 70–99)
Phosphorus: 2.4 mg/dL — ABNORMAL LOW (ref 2.5–4.6)
Potassium: 3.5 mmol/L (ref 3.5–5.1)
Sodium: 145 mmol/L (ref 135–145)

## 2019-03-08 LAB — CBC WITH DIFFERENTIAL/PLATELET
Abs Immature Granulocytes: 0.29 10*3/uL — ABNORMAL HIGH (ref 0.00–0.07)
Basophils Absolute: 0.1 10*3/uL (ref 0.0–0.1)
Basophils Relative: 1 %
Eosinophils Absolute: 0.2 10*3/uL (ref 0.0–0.5)
Eosinophils Relative: 2 %
HCT: 38.2 % — ABNORMAL LOW (ref 39.0–52.0)
Hemoglobin: 11.7 g/dL — ABNORMAL LOW (ref 13.0–17.0)
Immature Granulocytes: 3 %
Lymphocytes Relative: 35 %
Lymphs Abs: 3.8 10*3/uL (ref 0.7–4.0)
MCH: 30.5 pg (ref 26.0–34.0)
MCHC: 30.6 g/dL (ref 30.0–36.0)
MCV: 99.7 fL (ref 80.0–100.0)
Monocytes Absolute: 0.7 10*3/uL (ref 0.1–1.0)
Monocytes Relative: 6 %
Neutro Abs: 5.7 10*3/uL (ref 1.7–7.7)
Neutrophils Relative %: 53 %
Platelets: 102 10*3/uL — ABNORMAL LOW (ref 150–400)
RBC: 3.83 MIL/uL — ABNORMAL LOW (ref 4.22–5.81)
RDW: 16.6 % — ABNORMAL HIGH (ref 11.5–15.5)
WBC: 10.7 10*3/uL — ABNORMAL HIGH (ref 4.0–10.5)
nRBC: 0 % (ref 0.0–0.2)

## 2019-03-08 LAB — C-REACTIVE PROTEIN: CRP: <0.8 mg/dL (ref <1.0)

## 2019-03-08 LAB — D-DIMER, QUANTITATIVE: D-Dimer, Quant: 0.38 ug/mL-FEU (ref 0.00–0.50)

## 2019-03-08 MED ORDER — KCL IN DEXTROSE-NACL 10-5-0.45 MEQ/L-%-% IV SOLN
INTRAVENOUS | Status: DC
  Administered 2019-03-08: 18:00:00 via INTRAVENOUS
  Filled 2019-03-08 (×2): qty 1000

## 2019-03-08 MED ORDER — DIPHENOXYLATE-ATROPINE 2.5-0.025 MG PO TABS
2.0000 | ORAL_TABLET | Freq: Four times a day (QID) | ORAL | Status: DC | PRN
  Filled 2019-03-08: qty 2

## 2019-03-08 NOTE — Progress Notes (Signed)
OT Treatment Note  B Prevalon boots positioned  - relieving pressure B heels and from scissoring of L heel onto R. Pt to wear Prevalon boots while in bed, removing for self care and as needed for repositioning. Pt seen during meal - able to self feed with occasional min A after set up. Tubing added to utensil to help with grip. Pt would most likely benefit from curved spoon and lidded rigid cup. Will assess to help increase PO intake.     03/08/19 1800  OT Visit Information  Last OT Received On 03/08/19  Assistance Needed +2  History of Present Illness 63 year old right-handed male w/ hx spinal stenosis s/p neck surgery,  epilepsy, CKD, encephalopathy, mood disorder and schizophrenia as well as bipolar disease, HTN, gout, severe sepsis and septic shock, HLD, anemia, hypothyroidism, constipation, and immobility, who was suspected to have Parkinson's disease sometime ago. w/c bound 3 years ago now bed bound for 3 years. Dx w/ COVID in 06/20 but did not need tx. now admitted w/ sepsis  Precautions  Precautions Fall  Precaution Comments At risk for skin breakdown  Pain Assessment  Pain Assessment Faces  Faces Pain Scale 2  Pain Location general discomfot  Pain Descriptors / Indicators Discomfort  ADL  Eating/Feeding Minimal assistance  Eating/Feeding Details (indicate cue type and reason) Red tubing added to utensil. Able to scoop food - spillage on tray. Mindifficulty orienting spoon in hand; Ableo hold cup however noted min crushing of cup. May benefit from lidded rigid cup  General Comments  General comments (skin integrity, edema, etc.) B Prevalon boots position to prevent pressure of R heel from scissoring and pressure on B heels - nsg made aware.  OT - End of Session  Activity Tolerance Patient tolerated treatment well  Patient left in bed;with call bell/phone within reach;with bed alarm set;with nursing/sitter in room  Nurse Communication Other (comment) (prevalon boots; use of tubing/   set up for feeding)  OT Assessment/Plan  OT Plan Discharge plan remains appropriate  OT Visit Diagnosis Other abnormalities of gait and mobility (R26.89);Muscle weakness (generalized) (M62.81);Other symptoms and signs involving cognitive function  OT Frequency (ACUTE ONLY) Min 2X/week  Follow Up Recommendations SNF;Supervision/Assistance - 24 hour  OT Equipment None recommended by OT  AM-PAC OT "6 Clicks" Daily Activity Outcome Measure (Version 2)  Help from another person eating meals? 3  Help from another person taking care of personal grooming? 3  Help from another person toileting, which includes using toliet, bedpan, or urinal? 1  Help from another person bathing (including washing, rinsing, drying)? 2  Help from another person to put on and taking off regular upper body clothing? 2  Help from another person to put on and taking off regular lower body clothing? 1  6 Click Score 12  OT Goal Progression  Progress towards OT goals Progressing toward goals  OT Time Calculation  OT Start Time (ACUTE ONLY) 1746  OT Stop Time (ACUTE ONLY) 1808  OT Time Calculation (min) 22 min  OT General Charges  $OT Visit 1 Visit  OT Treatments  $Therapeutic Activity 8-22 mins  Maurie Boettcher, OT/L   Acute OT Clinical Specialist [place] Pager [phone] Office [phone]

## 2019-03-08 NOTE — Progress Notes (Signed)
PROGRESS NOTE  Jamie LemonsJames Edmund Drake  ZOX:[id number] DOB: 10/14/1955 DOA: 03/06/2019 PCP: Galvin ProfferHague, Imran P, MD  Brief Narrative: Jamie CorwinJames Edmund Drake is a 63 y.o. male with a history of spinal stenosis s/p neck surgery, bedbound, seizure disorder, bipolar disorder, schizophrenia, stage III CKD, HTN, HLD, anemia who was [facility] long term care [facility] for being "nonresponsive to verbal stimuli" and was found to have sepsis with E. coli UTI. He had been admitted in June 2020 for covid-19 positivity but never required treatment and again tested positive, [facility]. Antibiotics given with improvement in vital signs and labs. CXR showed subtle left base density, scarring and atelectasis vs. infiltrate, though he did not have documented hypoxia. Ceftriaxone has been continued.   Assessment & Plan: Active Problems:   AKI (acute kidney injury) ([facility])   COVID-19   Renal insufficiency   Sepsis due to urinary tract infection ([facility])  Sepsis due to E. coli UTI: Febrile, tachycardic, neutrophilic leukocytosis at 22.8k > 13.8k. PCT 1.48 > 0.90, CRP elevated at 67.6 slightly less specific. Lactic acid reassuringly 1.6. UA showed cloudy urine SpGrav 1.010 with TNTC RBC, WBC, and 4+ bacteria. Troponin negative x2 in ED - All inflammatory markers including leukocytosis improving with fluids, abx.  - Urine culture from 10/18 shows resistance only to ciprofloxacin, intermediate levofloxacin, negative ESBL. Continue ceftriaxone 2g IV q24h - Blood cultures no growth at 4 days, [facility] today.  BPH: Predisposition for UTI. - Continue flomax - Continue condom catheter due to incontinence and monitor UOP.  LLL infiltrate: Unclear whether this is clinically salient at this point in the absence of pulmonary signs or symptoms. Appears stable from prior CXR on repeat 10/20.  - Coninue ceftriaxone as aboe. Could consider adding anaerobic coverage as  he's at risk of aspiration, though as discussed above, it appears bacterial pneumonia is unlikely at this time. Will have low threshold to start.  - Note MRSA PCR was positive and pt in congregate living facility, though this presentation not typical of MRSA PNA, so would not continue coverage at this time.   Covid-19 positive: Pt has sepsis, most attributable to UTI. Tested positive 10/18 after ~4 months since last known positive. He was admitted here in June 2020 and required no directed treatment at that time. Very unclear case of sustained viral shedding vs. reinfection which is possible in a congregate living facility. Regardless, would be candidate for treatment if indicated.  - CXR not showing typical covid multifocal opacities, CRP minimally elevated with concomitant PCT elevation not consistent with viral cytokine storm. Not on oxygen. Will not start steroids or remdesivir at this time.  - Continue airborne, contact precautions. PPE including surgical gown, gloves, cap, shoe covers, and CAPR used during this encounter in a negative pressure room.  - Check daily labs: CBC w/diff, CMP, d-dimer, ferritin, CRP  - Enoxaparin prophylactic dose.   - Vitamin C, zinc - Antitussives in case this is necessary, inhaler prn.  - Avoid NSAIDs - Recommend incentive spirometry, instructed patient who pulled ~900cc.  - Was given decadron 10mg  IV, which we will defer continuing for now  Stage II pressure injury to right buttocks and stage 1 on sacrum coccyx: POA. - Offload as able - Optimize nutrition - Mepilex applied.  Bipolar disorder, schizophrenia, , seizure disorder, cervical stenosis with chronic myelopathy, paraplegia: Note pt assessed by Dr. Frances FurbishAthar, Neurology, as outpatient who did not believe he is  demonstrating symptoms of Parkinson's Disease, so no sinemet given. - Continue home depakote 750mg  qAM, 1g qPM, level nearly normal at 46.3.  - Continue cogentin 1mg  po BID, - Continue perphenazine  2mg  po BID - Continue gabapentin 100mg  po TID prn with plan to switch back to scheduled if CrCl allows (pending) - Continue keppra 500mg  po BID - prn ativan - Delirium precautions - Aspiration precautions, dysphagia 3 diet per previous admission  HTN:  - Hold metoprolol 12.5mg  q12h since BP has been low, currently normotensive  Anemia of chronic kidney disease: Stable from historic baseline after initial hemoconcentration-related rise.  - Monitor prn and if bleeding noted.   Thrombocytopenia: Chronic ~unchanged from prior admission. Unclear whether related to covid or other. Time course not consistent with HIT.  - Monitor platelet count, bleeding. Currently ok to continue lovenox.  Hypernatremia: Na 152, since improved to 144 with IVF.  - Continue IVF while taking limited po.  AKI on stage III CKD: Cr elevated on admission, remains elevated but improved with IVF. Baseline presumed to be ~1.6.  - Continue IVF again today, improving, renally dose medications as appropriate. - Do not continue vancomycin or other nephrotoxins  Hyperlipidemia:  - Continue Lipitor 40mg   DVT prophylaxis: Lovenox 30mg  q24h Code Status: DNR Family Communication: None at bedside, spoke with mother by phone at length today. Disposition Plan: Return to facility once clinically improved, renal function improved, and no development of hypoxia necessitating covid treatment. Possibly 10/23.   Consultants:   None  Procedures:   None  Antimicrobials:  Ceftriaxone 10/18 >>    Subjective: Much more alert, less confused, conversant. Wants more ice for his water. Having loose stools, no abd pain, eating ok. Continue to deny shortness of breath or cough.  Objective: Vitals:   03/07/19 0723 03/07/19 1629 03/07/19 2015 03/08/19 0600  BP: 91/71 90/63 99/63  103/68  Pulse: (!) 50 72 66 66  Resp: 16 18 16 16   Temp: (!) 97.5 F (36.4 C) 97.9 F (36.6 C) 98.1 F (36.7 C) 97.9 F (36.6 C)  TempSrc: Oral  Oral Oral Oral  SpO2: 100% 99% 100% 100%  Weight:      Height:        Intake/Output Summary (Last 24 hours) at 03/08/2019 0713 Last data filed at 03/08/2019 0531 Gross per 24 hour  Intake 2937.28 ml  Output 1202 ml  Net 1735.28 ml   Filed Weights   03/06/19 2141  Weight: 59.8 kg   Gen: 63 y.o. male in no distress Pulm: Nonlabored breathing room air. Clear. CV: Regular rate and rhythm. No murmur, rub, or gallop. No JVD, no dependent edema. GI: Abdomen soft, non-tender, non-distended, with normoactive bowel sounds.  Ext: Warm, no deformities, stable atrophy. Skin: No new rashes, lesions or ulcers on visualized skin. Sacrum not examined. Neuro: Alert, more oriented. No changes in focal neurological deficits. Psych: Judgement and insight appear fair. Mood euthymic & affect congruent. Behavior is appropriate.    Data Reviewed: I have personally reviewed following labs and imaging studies  CBC: Recent Labs  Lab 03/06/19 1654 03/07/19 0150 03/08/19 0600  WBC 14.3* 11.3* 10.7*  NEUTROABS 12.3* 8.0* 5.7  HGB 12.7* 12.1* 11.7*  HCT 41.2 40.0 38.2*  MCV 99.3 100.5* 99.7  PLT 132* 103* 387*   Basic Metabolic Panel: Recent Labs  Lab 03/06/19 1654 03/07/19 0150  NA 148* 144  K 3.5 3.9  CL 112* 111  CO2 22 19*  GLUCOSE 110* 132*  BUN 60* 56*  CREATININE 2.66* 2.25*  CALCIUM 9.0 8.9  MG  --  2.4   GFR: Estimated Creatinine Clearance: 29.2 mL/min (A) (by C-G formula based on SCr of 2.25 mg/dL (H)). Liver Function Tests: Recent Labs  Lab 03/06/19 1654 03/07/19 0150  AST 20 23  ALT 14 14  ALKPHOS 83 82  BILITOT 0.9 0.3  PROT 6.8 6.4*  ALBUMIN 2.6* 2.5*   Anemia Panel: Recent Labs    03/07/19 0150  FERRITIN 252   Radiology Studies: Dg Chest 1 View  Result Date: 03/06/2019 CLINICAL DATA:  Shortness of breath. EXAM: CHEST  1 VIEW COMPARISON:  03/04/2019 FINDINGS: Heart size and vascularity are normal. Persistent faint increased density at the left lung base  with slight atelectasis or scarring at the left lung base. The lungs are otherwise clear. No bone abnormality. IMPRESSION: 1. No significant change since the prior study. 2. Persistent faint density at the left lung base. Electronically Signed   By: Francene Boyers M.D.   On: 03/06/2019 19:29    Scheduled Meds: . atorvastatin  40 mg Oral q1800  . benztropine  1 mg Oral BID  . divalproex  1,000 mg Oral QHS  . divalproex  750 mg Oral Daily  . docusate sodium  100 mg Oral BID  . enoxaparin (LOVENOX) injection  30 mg Subcutaneous Q24H  . feeding supplement (ENSURE ENLIVE)  237 mL Oral TID BM  . levETIRAcetam  500 mg Oral BID  . multivitamin with minerals  1 tablet Oral Daily  . perphenazine  2 mg Oral BID  . polyvinyl alcohol  1 drop Both Eyes Daily  . sodium chloride flush  3 mL Intravenous Q12H  . tamsulosin  0.4 mg Oral Daily  . vitamin C  500 mg Oral Daily  . zinc sulfate  220 mg Oral Daily   Continuous Infusions: . sodium chloride    . cefTRIAXone (ROCEPHIN)  IV Stopped (03/07/19 1900)  . dextrose 5 % and 0.45 % NaCl with KCl 10 mEq/L 100 mL/hr at 03/08/19 0337     LOS: 2 days   Time spent: 25 minutes.  Tyrone Nine, MD [facility] [email or web address] Password [email or web address] 03/08/2019, 7:13 AM

## 2019-03-08 NOTE — Progress Notes (Addendum)
Occupational Therapy Evaluation Patient Details Name: Nalu Troublefield MRN: [id number] DOB: 05-17-1956 Today's Date: 03/08/2019    History of Present Illness 63 y.o. male with a history of spinal stenosis s/p neck surgery, bedbound, seizure disorder, bipolar disorder, schizophrenia, stage III CKD, HTN, HLD, anemia who was brought from long term care facility to [facility] ED on 10/18 for being "nonresponsive to verbal stimuli" and was found to have sepsis with E. coli UTI. Covid +.    Clinical Impression   PTA, pt living in SNF and apparently mostly bed bound. Pt will need to be lifted to chair if he desires with Maximove.. Pt with BLE knee extension contractures with scissoring due to apparent spasticity. Ordered B Prevalon Boots to decrease pressure on B heels. Pt has difficulty at times feedng himself - will further assess with use of AE. Pt very excited to work with therapy. Anticipate return to SNF.     Follow Up Recommendations  SNF;Supervision/Assistance - 24 hour    Equipment Recommendations  None recommended by OT    Recommendations for Other Services       Precautions / Restrictions Precautions Precaution Comments: At risk for skin breakdown Restrictions Weight Bearing Restrictions: No      Mobility Bed Mobility               General bed mobility comments: Max A to hold; pt ableto hold rail and assist mininally  Transfers                 General transfer comment: Maximove    Balance                                           ADL either performed or assessed with clinical judgement   ADL Overall ADL's : Needs assistance/impaired Eating/Feeding: Set up Eating/Feeding Details (indicate cue type and reason): Staes difficulty holding onto utensils at times; may benefit form built up tubing Grooming: Minimal assistance Grooming Details (indicate cue type and reason): Able to brush teeth after set up. Perseverates making it difficult  toswitch task to washing face; requires Hand over hand to initiate and sustain attention to task Upper Body Bathing: Moderate assistance;Bed level   Lower Body Bathing: Total assistance   Upper Body Dressing : Maximal assistance;Bed level   Lower Body Dressing: Total assistance               Functional mobility during ADLs: (requires Lift at baseline)       Vision         Perception     Praxis Praxis Praxis-Other Comments: perseverates    Pertinent Vitals/Pain       Hand Dominance Right   Extremity/Trunk Assessment Upper Extremity Assessment Upper Extremity Assessment: Generalized weakness(ROM overall WFL; decrased fine motor coordination; tremor)   Lower Extremity Assessment Lower Extremity Assessment: RLE deficits/detail;LLE deficits/detail RLE Deficits / Details: Knee extension contracture; increased spasticity; scissors; L heel pushes on R medical heel; downgoing toes LLE Deficits / Details: similar to R   Cervical / Trunk Assessment Cervical / Trunk Assessment: Other exceptions Cervical / Trunk Exceptions: posterior pelvic tilt; kyphotic; very rigid trunk with limited mobility   Communication     Cognition Arousal/Alertness: Awake/alert Behavior During Therapy: Anxious Overall Cognitive Status: No family/caregiver present to determine baseline cognitive functioning Area of Impairment: Orientation;Attention;Memory;Following commands;Safety/judgement;Awareness;Problem solving  Orientation Level: Disoriented to;Place Current Attention Level: Sustained Memory: Decreased recall of precautions;Decreased short-term memory Following Commands: Follows one step commands with increased time Safety/Judgement: Decreased awareness of safety;Decreased awareness of deficits Awareness: Intellectual Problem Solving: Slow processing;Decreased initiation;Difficulty sequencing;Requires verbal cues;Requires tactile cues General Comments:  perseverates   General Comments       Exercises Exercises: Other exercises Other Exercises Other Exercises: incentive spirometer x 10   Shoulder Instructions      Home Living                                          Prior Functioning/Environment Level of Independence: Needs assistance  Gait / Transfers Assistance Needed: Lift to chair ADL's / Homemaking Assistance Needed: Able to feed self and assist with UB ADL            OT Problem List: Decreased strength;Decreased range of motion;Decreased activity tolerance;Impaired balance (sitting and/or standing);Decreased cognition;Decreased coordination;Decreased safety awareness;Decreased knowledge of use of DME or AE;Impaired tone;Impaired sensation      OT Treatment/Interventions: Self-care/ADL training;Therapeutic exercise;Neuromuscular education;DME and/or AE instruction;Therapeutic activities;Cognitive remediation/compensation;Patient/family education;Balance training    OT Goals(Current goals can be found in the care plan section) Acute Rehab OT Goals Patient Stated Goal: to work with therapy OT Goal Formulation: With patient Time For Goal Achievement: 03/22/19 Potential to Achieve Goals: Good ADL Goals Pt Will Perform Eating: with set-up;sitting Pt Will Perform Grooming: with set-up;sitting Pt/caregiver will Perform Home Exercise Program: Both right and left upper extremity;With written HEP provided;With minimal assist;With theraband  OT Frequency: Min 2X/week   Barriers to D/C:            Co-evaluation              AM-PAC OT "6 Clicks" Daily Activity     Outcome Measure Help from another person eating meals?: A Little Help from another person taking care of personal grooming?: A Little Help from another person toileting, which includes using toliet, bedpan, or urinal?: Total Help from another person bathing (including washing, rinsing, drying)?: A Lot Help from another person to put on and  taking off regular upper body clothing?: A Lot Help from another person to put on and taking off regular lower body clothing?: Total 6 Click Score: 12   End of Session Nurse Communication: Mobility status;Other (comment)(need for Prevalon boots B LE)  Activity Tolerance: Patient tolerated treatment well Patient left: in bed;with call bell/phone within reach;with bed alarm set  OT Visit Diagnosis: Other abnormalities of gait and mobility (R26.89);Muscle weakness (generalized) (M62.81);Other symptoms and signs involving cognitive function(paresis)                Time: 8546-2703 OT Time Calculation (min): 29 min Charges:  OT General Charges $OT Visit: 1 Visit OT Evaluation $OT Eval Moderate Complexity: 1 Mod OT Treatments $Self Care/Home Management : 8-22 mins Luisa Dago, OT/L   Acute OT Clinical Specialist Acute Rehabilitation Services Pager [phone] Office [phone]   [facility] 03/08/2019, 10:33 AM

## 2019-03-08 NOTE — Evaluation (Signed)
Physical Therapy Evaluation Patient Details Name: Jamie Drake MRN: [id number] DOB: 11/10/1955 Today's Date: 03/08/2019   History of Present Illness  63 year old right-handed male w/ hx spinal stenosis s/p neck surgery,  epilepsy, CKD, encephalopathy, mood disorder and schizophrenia as well as bipolar disease, HTN, gout, severe sepsis and septic shock, HLD, anemia, hypothyroidism, constipation, and immobility, who was suspected to have Parkinson's disease sometime ago. w/c bound 3 years ago now bed bound for 3 years. Dx w/ COVID in 06/20 but did not need tx. now admitted w/ sepsis  Clinical Impression   Pt admitted with above diagnosis. PTA was living in SNF and assisted with all ADLs and IADLs. Pt has been educated on need to be more proactive while at home with asking staff to assist him with movement and also transfers. He is highly motivated to work with therapy and gain some function. Pt currently with functional limitations due to the deficits lin range of motion, strength, activity tolerance, and independence. Pt will benefit from skilled PT to increase their independence and safety with mobility to allow discharge to the venue listed below.       Follow Up Recommendations SNF    Equipment Recommendations  None recommended by PT    Recommendations for Other Services       Precautions / Restrictions Precautions Precautions: Fall Precaution Comments: At risk for skin breakdown Required Braces or Orthoses: Other Brace Other Brace: positional boots when in bed Restrictions Weight Bearing Restrictions: No      Mobility  Bed Mobility Overal bed mobility: Needs Assistance Bed Mobility: Rolling;Supine to Sit;Sit to Supine Rolling: Max assist   Supine to sit: Total assist Sit to supine: Total assist   General bed mobility comments: was able to sit EOB briefly but needs max-total a to maintain sitting, poor range of motion in BLE, poor trunk control to maintain  sitting  Transfers Overall transfer level: Needs assistance               General transfer comment: did not attempt transfer after sitting EOB  Ambulation/Gait             General Gait Details: Pt is non ambulatory  Stairs            Wheelchair Mobility    Modified Rankin (Stroke Patients Only)       Balance Overall balance assessment: Needs assistance   Sitting balance-Leahy Scale: Zero Sitting balance - Comments: needs max-total a to maintain sitting EOB, poor range of motion and core strength Postural control: Posterior lean;Right lateral lean;Left lateral lean   Standing balance-Leahy Scale: Zero Standing balance comment: pt unable to stand                             Pertinent Vitals/Pain Pain Assessment: Faces Faces Pain Scale: Hurts whole lot Pain Location: BLE L>R with any movement Pain Descriptors / Indicators: Grimacing;Moaning;Guarding Pain Intervention(s): Limited activity within patient's tolerance    Home Living Family/patient expects to be discharged to:: Skilled nursing facility                 Additional Comments: has been in SNF for some time now has been w/c bound then bed bound    Prior Function Level of Independence: Needs assistance   Gait / Transfers Assistance Needed: Lift to chair  ADL's / Homemaking Assistance Needed: able to feed himself but states that they did not work on him being  able to do so at the home hence has needed assist with feeding  Comments: Pt needs assist in all aspects of daily living activities     Hand Dominance   Dominant Hand: Right    Extremity/Trunk Assessment   Upper Extremity Assessment Upper Extremity Assessment: Defer to OT evaluation    Lower Extremity Assessment Lower Extremity Assessment: RLE deficits/detail;LLE deficits/detail;Generalized weakness RLE Deficits / Details: Knee extension contracture; increased spasticity; scissors; L heel pushes on R medical  heel; downgoing toes LLE Deficits / Details: similar to R    Cervical / Trunk Assessment Cervical / Trunk Exceptions: poor trunk control, retropulsion in sitting position , noted pushing with LUE  Communication   Communication: Receptive difficulties;Expressive difficulties  Cognition Arousal/Alertness: Awake/alert Behavior During Therapy: Anxious Overall Cognitive Status: No family/caregiver present to determine baseline cognitive functioning Area of Impairment: Memory;Attention;Safety/judgement;Awareness;Following commands;Problem solving                 Orientation Level: Disoriented to;Place;Person Current Attention Level: Sustained Memory: Decreased recall of precautions Following Commands: Follows one step commands inconsistently;Follows one step commands with increased time Safety/Judgement: Decreased awareness of safety;Decreased awareness of deficits Awareness: Intellectual Problem Solving: Slow processing;Decreased initiation;Difficulty sequencing;Requires verbal cues;Requires tactile cues General Comments: needs continued cues verbal and also tactile to complete same tasks      General Comments General comments (skin integrity, edema, etc.): PTA pt was living in SNF, seems he was assisted with all ADLs and AIDLs. He states he needed help with feeding but was noted to be able to feed himself during assessment. He is needing maxmove for transfers sec to poor range of motion, poor core strength and overall strength. Pt states has not been sitting up in some time prior to this assessment. Pt verbalizes pain with PROM in BLE, R>L. BLE are very rigid with foot drop in B feet. BUE with more range but poor strength. Pt states would like some theraband to complete exercises, he does well with x 5 reps but needs AAROM to complete 10 reps.    Exercises Total Joint Exercises Ankle Circles/Pumps: PROM Heel Slides: PROM Hip ABduction/ADduction: PROM Straight Leg Raises: PROM Knee  Flexion: PROM General Exercises - Upper Extremity Shoulder Flexion: AAROM;Theraband;10 reps Theraband Level (Shoulder Flexion): Level 1 (Yellow) Shoulder Horizontal ABduction: AAROM;10 reps;Theraband Theraband Level (Shoulder Horizontal Abduction): Level 1 (Yellow) Elbow Flexion: AAROM;10 reps;Theraband Theraband Level (Elbow Flexion): Level 1 (Yellow)   Assessment/Plan    PT Assessment Patient needs continued PT services  PT Problem List Decreased strength;Decreased activity tolerance;Decreased mobility;Decreased cognition;Decreased safety awareness;Impaired tone;Decreased range of motion;Decreased balance;Decreased coordination;Pain       PT Treatment Interventions Functional mobility training;Therapeutic exercise;Neuromuscular re-education;Patient/family education;Manual techniques;Balance training;Therapeutic activities    PT Goals (Current goals can be found in the Care Plan section)  Acute Rehab PT Goals Patient Stated Goal: to work with therapy Time For Goal Achievement: 03/22/19 Potential to Achieve Goals: Fair    Frequency Min 2X/week   Barriers to discharge        Co-evaluation               AM-PAC PT "6 Clicks" Mobility  Outcome Measure Help needed turning from your back to your side while in a flat bed without using bedrails?: A Lot Help needed moving from lying on your back to sitting on the side of a flat bed without using bedrails?: Total Help needed moving to and from a bed to a chair (including a wheelchair)?: Total Help needed standing up  from a chair using your arms (e.g., wheelchair or bedside chair)?: Total Help needed to walk in hospital room?: Total Help needed climbing 3-5 steps with a railing? : Total 6 Click Score: 7    End of Session   Activity Tolerance: Patient limited by pain;Patient limited by fatigue;Treatment limited secondary to medical complications (Comment) Patient left: in bed;with call bell/phone within reach Nurse  Communication: Mobility status;Precautions;Need for lift equipment PT Visit Diagnosis: Muscle weakness (generalized) (M62.81);Other symptoms and signs involving the nervous system (R29.898)    Time: 6962-9528 PT Time Calculation (min) (ACUTE ONLY): 38 min   Charges:   PT Evaluation $PT Eval High Complexity: 1 High PT Treatments $Therapeutic Exercise: 8-22 mins $Therapeutic Activity: 8-22 mins        Drema Pry, PT   Freddi Starr 03/08/2019, 1:31 PM

## 2019-03-09 LAB — CBC WITH DIFFERENTIAL/PLATELET
Abs Immature Granulocytes: 0.32 10*3/uL — ABNORMAL HIGH (ref 0.00–0.07)
Basophils Absolute: 0.1 10*3/uL (ref 0.0–0.1)
Basophils Relative: 1 %
Eosinophils Absolute: 0.4 10*3/uL (ref 0.0–0.5)
Eosinophils Relative: 4 %
HCT: 37.1 % — ABNORMAL LOW (ref 39.0–52.0)
Hemoglobin: 11.5 g/dL — ABNORMAL LOW (ref 13.0–17.0)
Immature Granulocytes: 4 %
Lymphocytes Relative: 34 %
Lymphs Abs: 3.1 10*3/uL (ref 0.7–4.0)
MCH: 31.2 pg (ref 26.0–34.0)
MCHC: 31 g/dL (ref 30.0–36.0)
MCV: 100.5 fL — ABNORMAL HIGH (ref 80.0–100.0)
Monocytes Absolute: 0.7 10*3/uL (ref 0.1–1.0)
Monocytes Relative: 8 %
Neutro Abs: 4.6 10*3/uL (ref 1.7–7.7)
Neutrophils Relative %: 49 %
Platelets: 106 10*3/uL — ABNORMAL LOW (ref 150–400)
RBC: 3.69 MIL/uL — ABNORMAL LOW (ref 4.22–5.81)
RDW: 17.2 % — ABNORMAL HIGH (ref 11.5–15.5)
WBC: 9.2 10*3/uL (ref 4.0–10.5)
nRBC: 0 % (ref 0.0–0.2)

## 2019-03-09 LAB — RENAL FUNCTION PANEL
Albumin: 2.3 g/dL — ABNORMAL LOW (ref 3.5–5.0)
Anion gap: 5 (ref 5–15)
BUN: 25 mg/dL — ABNORMAL HIGH (ref 8–23)
CO2: 25 mmol/L (ref 22–32)
Calcium: 8.9 mg/dL (ref 8.9–10.3)
Chloride: 113 mmol/L — ABNORMAL HIGH (ref 98–111)
Creatinine, Ser: 1.55 mg/dL — ABNORMAL HIGH (ref 0.61–1.24)
GFR calc Af Amer: 55 mL/min — ABNORMAL LOW (ref >60)
GFR calc non Af Amer: 48 mL/min — ABNORMAL LOW (ref >60)
Glucose, Bld: 102 mg/dL — ABNORMAL HIGH (ref 70–99)
Phosphorus: 1.8 mg/dL — ABNORMAL LOW (ref 2.5–4.6)
Potassium: 4.7 mmol/L (ref 3.5–5.1)
Sodium: 143 mmol/L (ref 135–145)

## 2019-03-09 LAB — D-DIMER, QUANTITATIVE: D-Dimer, Quant: 0.43 ug/mL-FEU (ref 0.00–0.50)

## 2019-03-09 LAB — C-REACTIVE PROTEIN: CRP: <0.8 mg/dL (ref <1.0)

## 2019-03-09 MED ORDER — CEPHALEXIN 500 MG PO CAPS: 500.0000 mg | ORAL_CAPSULE | Freq: Two times a day (BID) | ORAL | 0 refills | Status: AC

## 2019-03-09 NOTE — Progress Notes (Signed)
Patient will returned to [facility] by transport. Reported called.

## 2019-03-09 NOTE — TOC Transition Note (Signed)
Transition of Care [facility]) - CM/SW Discharge Note   Patient Details  Name: Jamie Drake MRN: [id number] Date of Birth: Nov 26, 1955  [facility] CM/SW Contact:  Weston Anna, LCSW Phone Number: 03/09/2019, 11:24 AM   Clinical Narrative:     Patient set to discharge back to [facility] today- please all report to [phone]. CSW notified patients mother, Dorian Pod. PTAR called for transportation.   Final next level of care: Skilled Nursing Facility Barriers to Discharge: No Barriers Identified   Patient Goals and CMS Choice        Discharge Placement              Patient chooses bed at: [facility] Patient to be transferred to facility by: [facility] Name of family member notified: Dorian Pod- mother Patient and family notified of of transfer: 03/09/19  Discharge Plan and Services                DME Arranged: N/A         HH Arranged: NA          Social Determinants of Health (SDOH) Interventions     Readmission Risk Interventions No flowsheet data found.

## 2019-03-09 NOTE — Discharge Summary (Signed)
Physician Discharge Summary  Jamie Drake Byas GGY:[id number] DOB: October 29, 1955 DOA: 03/06/2019  PCP: [name], MD  Admit date: 03/06/2019 Discharge date: 03/09/2019  Admitted From: SNF Disposition: SNF   Recommendations for Outpatient Follow-up:  1. Follow up with PCP in 1-2 weeks 2. Please obtain CMP and CBC   Home Health: N/A Equipment/Devices: Per SNF Discharge Condition: Stable CODE STATUS: DNR Diet recommendation: Dysphagia 3  Brief/Interim Summary: Jefferie Holston Norcomis a 63 y.o.malewith a history ofspinal stenosis s/p neck surgery, bedbound, seizure disorder, bipolar disorder, schizophrenia, stage III CKD, HTN, HLD, anemiawho was brought from long term care facility to [facility] ED on 10/18 for being "nonresponsive to verbal stimuli" and was found to have sepsis with E. coli UTI. He had been admitted in June 2020 for covid-19 positivity but never required treatment and again tested positive, prompting admission request at [facility]. Antibiotics given with improvement in vital signs and labs. CXR showed subtle left base density, scarring and atelectasis vs. infiltrate, though he did not have documented hypoxia. Ceftriaxone has been continued with continued clinical improvement. No further treatment for pneumonia has been rendered and pulmonary status remains at baseline. AKI has also improved since admission.   Discharge Diagnoses:  Principal Problem:   Sepsis due to urinary tract infection ([place]) Active Problems:   AKI (acute kidney injury) ([place])   COVID-19   Renal insufficiency   Pressure injury of skin   E coli bacteremia   Hyperlipidemia   CKD (chronic kidney disease), stage III   Hypernatremia   Anemia of chronic disease   HTN (hypertension)  Sepsis due to E. coli UTI: Febrile, tachycardic, neutrophilic leukocytosis at 03.5K > 13.8k. PCT 1.48 > 0.90, CRP elevated at 67.6 slightly less specific. Lactic acid reassuringly 1.6. UA showed cloudy urine SpGrav 1.010 with TNTC  RBC, WBC, and 4+ bacteria. Troponin negative x2 in ED - All inflammatory markers including leukocytosis improving. - Urine culture from 10/18 shows resistance only to ciprofloxacin, intermediate levofloxacin, negative ESBL, very similar to previous urine culture in the [facility] EMR.Can transition to keflex at discharge with planned final dose in the PM of 10/26.  - Blood cultures no growth at 4 days, confirmed with micro lab at [facility].  BPH: Predisposition for UTI. - Continue flomax. No evidence of retention during admission.  LLL infiltrate: Unclear whether this is clinically salient at this point in the absence of pulmonary signs or symptoms. Appears stable from prior CXR on repeat 10/20.   Covid-19positive: Pt has sepsis, most attributable to UTI. Tested positive 10/18 after ~4 months since last known positive. He was admitted here in June 2020 and required no directed treatment at that time.Very unclear case of sustained viral shedding vs. reinfection which is possible in a congregate living facility. Regardless, would be candidate for treatment if indicated. - Continue isolation per protocol.  - CXR not showing typical covid multifocal opacities, CRP minimally elevated with concomitant PCT elevation not consistent with viral cytokine storm. Not on oxygen. Will not start steroids or remdesivir at this time.  -Vitamin C, zinc - Antitussives in case this is necessary, inhaler prn. - Avoid NSAIDs - Recommend incentive spirometry - Was given decadron 10mg  IV, which we will defer continuing for now, and is possible cause for persistent but downward trending leukocytosis in afebrile patient.  Stage IIpressure injury toright buttocks and stage 1onsacrum coccyx: POA. - Offload as able - Optimize nutrition -Mepilex applied.  Bipolar disorder, schizophrenia, , seizure disorder, cervical stenosis with chronic myelopathy, paraplegia:Note  pt assessed by Dr. Frances Furbish, Neurology, as  outpatient who did not believe he is demonstrating symptoms of Parkinson's Disease, so no sinemet given. - Continue home medications with changes to what he was taking at facility.  HTN:  - Continue home medications  Anemia of chronic kidney disease: Stable from historic baseline after initial hemoconcentration-related rise.  - Monitor prn and if bleeding noted.   Thrombocytopenia: Chronic, stable.  - Monitor platelet count, bleeding. consider hematology evaluation if continues.  Hypernatremia: Na 152, since improved to 144 and pt able to take water by mouth, so no IV fluids required.  AKIon stage III CKD: Cr elevated on admission, improved. - Avoid nephrotoxins, renally dose medications as appropriate. Was given vancomycin in ED.  Hyperlipidemia:  - ContinueLipitor 40mg   Discharge Instructions  Allergies as of 03/09/2019   No Known Allergies     Medication List    STOP taking these medications   metoprolol succinate 25 MG 24 hr tablet Commonly known as: TOPROL-XL     TAKE these medications   acetaminophen 325 MG tablet Commonly known as: TYLENOL Take 650 mg by mouth every 6 (six) hours as needed.   antiseptic oral rinse Liqd 15 mLs by Mouth Rinse route every 6 (six) hours as needed for dry mouth.   ascorbic acid 500 MG tablet Commonly known as: VITAMIN C Take 500 mg by mouth daily.   atorvastatin 40 MG tablet Commonly known as: LIPITOR Take 40 mg by mouth daily.   benztropine 1 MG tablet Commonly known as: COGENTIN Take 1 mg by mouth 2 (two) times a day.   cephALEXin 500 MG capsule Commonly known as: KEFLEX Take 1 capsule (500 mg total) by mouth 2 (two) times daily for 3 days.   divalproex 500 MG DR tablet Commonly known as: DEPAKOTE Take 1,000 mg by mouth at bedtime.   divalproex 250 MG DR tablet Commonly known as: DEPAKOTE Take 750 mg by mouth daily.   docusate sodium 100 MG capsule Commonly known as: COLACE Take 200 mg by mouth daily.    Fish Oil 1000 MG Caps Take 1,000 mg by mouth daily.   gabapentin 100 MG capsule Commonly known as: NEURONTIN Take 100 mg by mouth 3 (three) times daily as needed for pain.   metoprolol tartrate 25 MG tablet Commonly known as: LOPRESSOR Take 12.5 mg by mouth 2 (two) times daily.   multivitamin tablet Take 1 tablet by mouth daily.   ondansetron 4 MG tablet Commonly known as: ZOFRAN Take 4 mg by mouth every 6 (six) hours as needed for nausea or vomiting.   perphenazine 2 MG tablet Commonly known as: TRILAFON Take 2 mg by mouth daily.   Systane Balance 0.6 % Soln Generic drug: Propylene Glycol Apply 1 drop to eye daily.   tamsulosin 0.4 MG Caps capsule Commonly known as: FLOMAX Take 0.4 mg by mouth daily.   vitamin E 400 UNIT capsule Take 400 Units by mouth daily.      Follow-up Information    [place], 03/11/2019, MD Follow up.   Specialty: Internal Medicine Contact information: [address] [place] [phone]          No Known Allergies  Consultations:  None  Procedures/Studies: Dg Chest 1 View  Result Date: 03/06/2019 CLINICAL DATA:  Shortness of breath. EXAM: CHEST  1 VIEW COMPARISON:  03/04/2019 FINDINGS: Heart size and vascularity are normal. Persistent faint increased density at the left lung base with slight atelectasis or scarring at the  left lung base. The lungs are otherwise clear. No bone abnormality. IMPRESSION: 1. No significant change since the prior study. 2. Persistent faint density at the left lung base. Electronically Signed   By: Francene BoyersJames  Maxwell M.D.   On: 03/06/2019 19:29   Subjective: Feels well, no fevers, chills, sweats, myalgias, arthralgias, cough, shortness of breath, hypoxia, or chest pain.  Discharge Exam: Vitals:   03/09/19 0543 03/09/19 0825  BP: (!) 108/58 110/81  Pulse: (!) 55 76  Resp: 17   Temp: 98.8 F (37.1 C) (!) 97.4 F (36.3 C)  SpO2: 93% 97%   General: Chronically ill-appearing male in no  distress eating breakfast Cardiovascular: RRR, S1/S2 +, no rubs, no gallops Respiratory: Nonlabored on room air, clear Abdominal: Soft, NT, ND, bowel sounds + Extremities: Atrophied, no edema, no cyanosis  Labs: Basic Metabolic Panel: Recent Labs  Lab 03/06/19 1654 03/07/19 0150 03/08/19 0600  NA 148* 144 145  K 3.5 3.9 3.5  CL 112* 111 115*  CO2 22 19* 20*  GLUCOSE 110* 132* 97  BUN 60* 56* 38*  CREATININE 2.66* 2.25* 1.74*  CALCIUM 9.0 8.9 8.8*  MG  --  2.4  --   PHOS  --   --  2.4*   Liver Function Tests: Recent Labs  Lab 03/06/19 1654 03/07/19 0150 03/08/19 0600  AST 20 23  --   ALT 14 14  --   ALKPHOS 83 82  --   BILITOT 0.9 0.3  --   PROT 6.8 6.4*  --   ALBUMIN 2.6* 2.5* 2.2*   CBC: Recent Labs  Lab 03/06/19 1654 03/07/19 0150 03/08/19 0600  WBC 14.3* 11.3* 10.7*  NEUTROABS 12.3* 8.0* 5.7  HGB 12.7* 12.1* 11.7*  HCT 41.2 40.0 38.2*  MCV 99.3 100.5* 99.7  PLT 132* 103* 102*   D-Dimer Recent Labs    03/07/19 0150 03/08/19 0600  DDIMER 0.52* 0.38   Microbiology Urine culture E. coli sensitive to all except fluoroquinolones Blood cultures also from 10/18 showed no growth at 4 days  Time coordinating discharge: Approximately 40 minutes  Tyrone Nineyan B Matilda Fleig, MD  [facility] 03/09/2019, 11:03 AM

## 2019-03-09 NOTE — NC FL2 (Signed)
[place] LEVEL OF CARE SCREENING TOOL     IDENTIFICATION  Patient Name: Jamie Drake Birthdate: 1955-07-07 Sex: male Admission Date (Current Location): 03/06/2019  [facility] and [place] Number:  Herbalist and Address:  [facility] [place] [place] [address], [place], [place] [address]      Provider Number: [id number]  Attending Physician Name and Address:  Patrecia Pour, MD  Relative Name and Phone Number:       Current Level of Care: Hospital Recommended Level of Care: [place] Prior Approval Number:    Date Approved/Denied:   PASRR Number: [id number]  Discharge Plan: SNF    Current Diagnoses: Patient Active Problem List   Diagnosis Date Noted  . E coli bacteremia 03/08/2019  . Hyperlipidemia 03/08/2019  . CKD (chronic kidney disease), stage III 03/08/2019  . Hypernatremia 03/08/2019  . Anemia of chronic disease 03/08/2019  . HTN (hypertension) 03/08/2019  . Sepsis due to urinary tract infection ([place]) 03/06/2019  . Pressure injury of skin 10/26/2018  . AKI (acute kidney injury) ([place]) 10/21/2018  . COVID-19 10/21/2018  . Renal insufficiency 10/21/2018  . UTI (urinary tract infection) 10/21/2018    Orientation RESPIRATION BLADDER Height & Weight     Self, Place  Normal External catheter Weight: 131 lb 12.8 oz (59.8 kg) Height:  5\' 10"  (177.8 cm)  BEHAVIORAL SYMPTOMS/MOOD NEUROLOGICAL BOWEL NUTRITION STATUS        Diet(see dc summary)  AMBULATORY STATUS COMMUNICATION OF NEEDS Skin   Extensive Assist Verbally                         Personal Care Assistance Level of Assistance  Bathing, Dressing, Feeding Bathing Assistance: Maximum assistance Feeding assistance: Maximum assistance Dressing Assistance: Maximum assistance     Functional Limitations Info             SPECIAL CARE FACTORS FREQUENCY  PT (By licensed PT), OT (By licensed OT)     PT Frequency: 5 OT Frequency: 5            Contractures      Additional Factors Info  Code Status, Allergies Code Status Info: DNR Allergies Info: NKA           Current Medications (03/09/2019):  This is the current hospital active medication list Current Facility-Administered Medications  Medication Dose Route Frequency Provider Last Rate Last Dose  . 0.9 %  sodium chloride infusion  250 mL Intravenous PRN Patrecia Pour, MD      . acetaminophen (TYLENOL) tablet 650 mg  650 mg Oral Q6H PRN Patrecia Pour, MD   650 mg at 03/09/19 0447  . albuterol (VENTOLIN HFA) 108 (90 Base) MCG/ACT inhaler 2 puff  2 puff Inhalation Q6H PRN Patrecia Pour, MD      . antiseptic oral rinse (BIOTENE) solution 15 mL  15 mL Mouth Rinse PRN Patrecia Pour, MD      . atorvastatin (LIPITOR) tablet 40 mg  40 mg Oral q1800 Patrecia Pour, MD   40 mg at 03/08/19 1743  . benztropine (COGENTIN) tablet 1 mg  1 mg Oral BID Patrecia Pour, MD   1 mg at 03/09/19 1039  . cefTRIAXone (ROCEPHIN) 2 g in sodium chloride 0.9 % 100 mL IVPB  2 g Intravenous Q24H Vance Gather B, MD 200 mL/hr at 03/08/19 1745 2 g at 03/08/19 1745  . chlorpheniramine-HYDROcodone (TUSSIONEX) 10-8 MG/5ML suspension  5 mL  5 mL Oral Q12H PRN Hazeline Junker B, MD      . dextrose 5 % and 0.45 % NaCl with KCl 10 mEq/L infusion   Intravenous Continuous Tyrone Nine, MD 100 mL/hr at 03/08/19 1745    . diphenoxylate-atropine (LOMOTIL) 2.5-0.025 MG per tablet 2 tablet  2 tablet Oral QID PRN Tyrone Nine, MD      . divalproex (DEPAKOTE) DR tablet 1,000 mg  1,000 mg Oral QHS Tyrone Nine, MD   1,000 mg at 03/08/19 2224  . divalproex (DEPAKOTE) DR tablet 750 mg  750 mg Oral Daily Tyrone Nine, MD   750 mg at 03/09/19 1040  . docusate sodium (COLACE) capsule 100 mg  100 mg Oral BID Tyrone Nine, MD   100 mg at 03/07/19 2032  . enoxaparin (LOVENOX) injection 30 mg  30 mg Subcutaneous Q24H Tyrone Nine, MD   30 mg at 03/08/19 1743  . feeding supplement (ENSURE ENLIVE) (ENSURE ENLIVE) liquid 237 mL  237  mL Oral TID BM Tyrone Nine, MD   237 mL at 03/09/19 1041  . gabapentin (NEURONTIN) capsule 100 mg  100 mg Oral TID PRN Tyrone Nine, MD   100 mg at 03/07/19 1614  . guaiFENesin-dextromethorphan (ROBITUSSIN DM) 100-10 MG/5ML syrup 10 mL  10 mL Oral Q4H PRN Tyrone Nine, MD      . levETIRAcetam (KEPPRA) tablet 500 mg  500 mg Oral BID Tyrone Nine, MD   500 mg at 03/09/19 1039  . LORazepam (ATIVAN) tablet 0.5 mg  0.5 mg Oral Q6H PRN Tyrone Nine, MD       Or  . LORazepam (ATIVAN) injection 0.5 mg  0.5 mg Intramuscular Q6H PRN Tyrone Nine, MD      . multivitamin with minerals tablet 1 tablet  1 tablet Oral Daily Tyrone Nine, MD   1 tablet at 03/09/19 1039  . ondansetron (ZOFRAN) tablet 4 mg  4 mg Oral Q6H PRN Tyrone Nine, MD       Or  . ondansetron [facility]) injection 4 mg  4 mg Intravenous Q6H PRN Tyrone Nine, MD      . perphenazine (TRILAFON) tablet 2 mg  2 mg Oral BID Tyrone Nine, MD   2 mg at 03/09/19 1039  . polyvinyl alcohol (LIQUIFILM TEARS) 1.4 % ophthalmic solution 1 drop  1 drop Both Eyes Daily Tyrone Nine, MD   1 drop at 03/09/19 1041  . sodium chloride flush (NS) 0.9 % injection 3 mL  3 mL Intravenous Q12H Tyrone Nine, MD   3 mL at 03/08/19 2224  . sodium chloride flush (NS) 0.9 % injection 3 mL  3 mL Intravenous PRN Tyrone Nine, MD      . tamsulosin (FLOMAX) capsule 0.4 mg  0.4 mg Oral Daily Hazeline Junker B, MD   0.4 mg at 03/09/19 1040  . vitamin C (ASCORBIC ACID) tablet 500 mg  500 mg Oral Daily Tyrone Nine, MD   500 mg at 03/09/19 1039  . zinc sulfate capsule 220 mg  220 mg Oral Daily Tyrone Nine, MD   220 mg at 03/09/19 1040     Discharge Medications: Please see discharge summary for a list of discharge medications.  Relevant Imaging Results:  Relevant Lab Results:   Additional Information SSN: [id number]  Donnie Coffin, LCSW

## 2019-11-15 DEATH — deceased

## 2019-12-13 IMAGING — DX PORTABLE ABDOMEN - 1 VIEW
1 series · 1 of 1 positions shown · non-contrast
Comparison: CT 06/03/2018

CLINICAL DATA: Vomiting.

EXAM:
PORTABLE ABDOMEN - 1 VIEW

[abdomen]
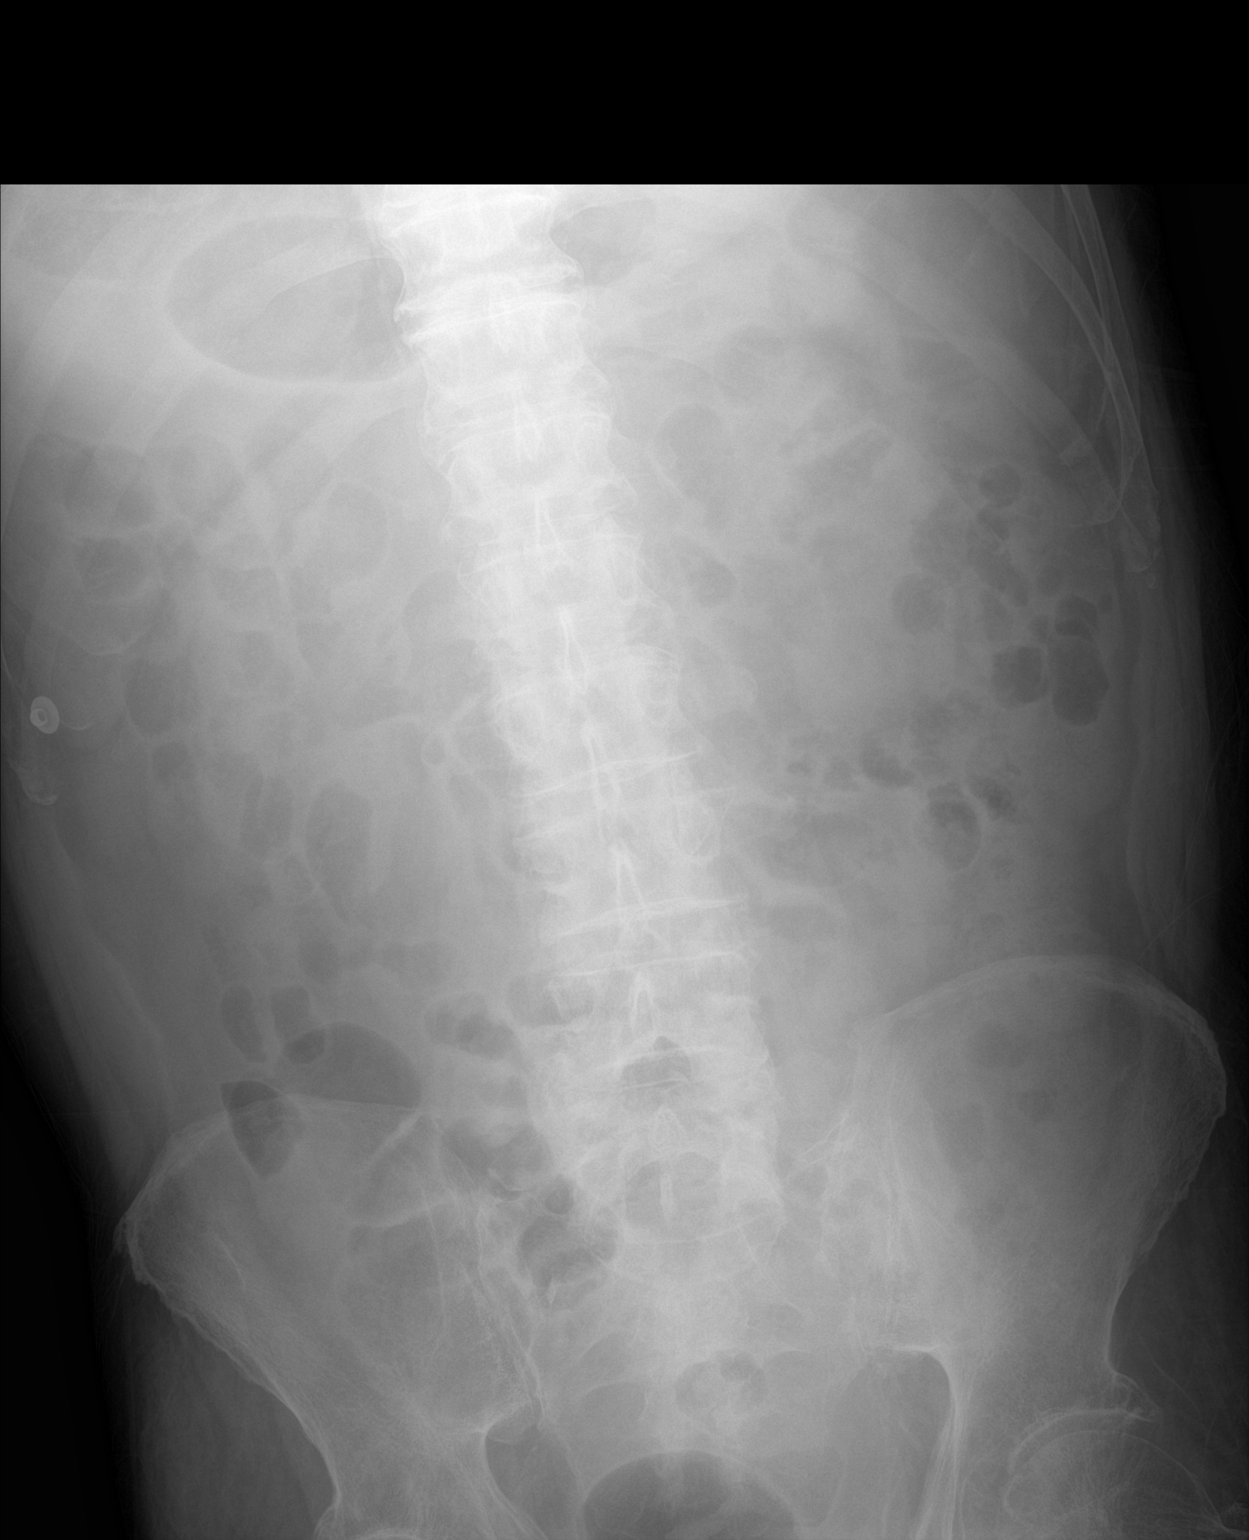

[1 of 1 positions shown; findings below may reference images not displayed]

FINDINGS: Exam demonstrates several air-filled loops of large and small bowel
without evidence of obstruction. No evidence of free peritoneal air.
No mass or mass effect. Degenerative change of the spine and left
hip.
IMPRESSION: Nonspecific, nonobstructive bowel gas pattern with air-filled
nondilated small bowel loops which may be due to viral
gastroenteritis.

## 2020-04-26 IMAGING — DX DG CHEST 1V
1 series · 1 of 1 positions shown · non-contrast
Comparison: 03/04/2019

CLINICAL DATA: Shortness of breath.

EXAM:
CHEST  1 VIEW

[chest]
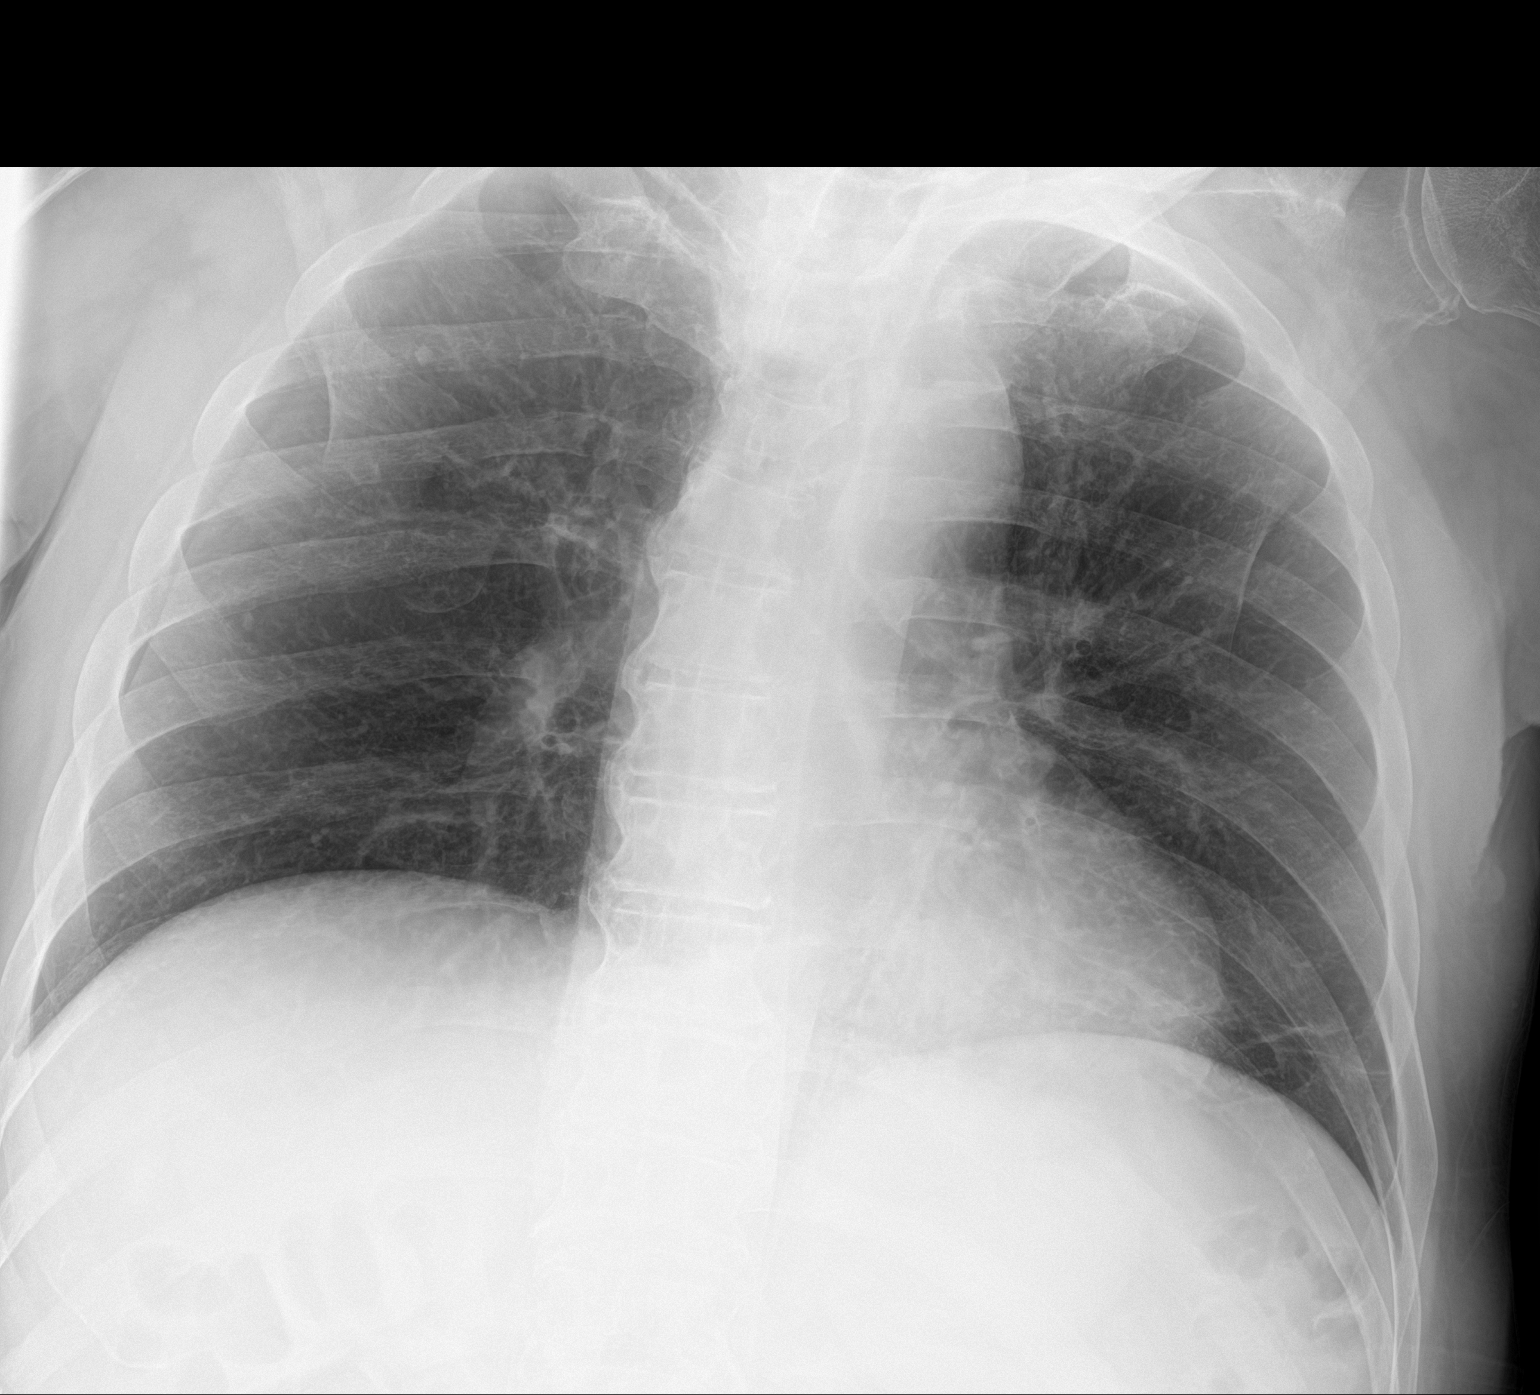

[1 of 1 positions shown; findings below may reference images not displayed]

FINDINGS: Heart size and vascularity are normal. Persistent faint increased
density at the left lung base with slight atelectasis or scarring at
the left lung base. The lungs are otherwise clear. No bone
abnormality.
IMPRESSION: 1. No significant change since the prior study.
2. Persistent faint density at the left lung base.
